# Patient Record
Sex: Female | Born: 1970 | Race: White | Hispanic: No | Smoking: Former smoker
Health system: Southern US, Community
[De-identification: ages and names within clinical notes are randomized; demographics above are authoritative.]

## PROBLEM LIST (undated history)

## (undated) DIAGNOSIS — I739 Peripheral vascular disease, unspecified: Secondary | ICD-10-CM

## (undated) DIAGNOSIS — R112 Nausea with vomiting, unspecified: Secondary | ICD-10-CM

## (undated) DIAGNOSIS — Z87442 Personal history of urinary calculi: Secondary | ICD-10-CM

## (undated) DIAGNOSIS — K219 Gastro-esophageal reflux disease without esophagitis: Secondary | ICD-10-CM

## (undated) DIAGNOSIS — K635 Polyp of colon: Secondary | ICD-10-CM

## (undated) DIAGNOSIS — F32A Depression, unspecified: Secondary | ICD-10-CM

## (undated) DIAGNOSIS — K5790 Diverticulosis of intestine, part unspecified, without perforation or abscess without bleeding: Secondary | ICD-10-CM

## (undated) DIAGNOSIS — F419 Anxiety disorder, unspecified: Secondary | ICD-10-CM

## (undated) DIAGNOSIS — K529 Noninfective gastroenteritis and colitis, unspecified: Secondary | ICD-10-CM

## (undated) DIAGNOSIS — Z9889 Other specified postprocedural states: Secondary | ICD-10-CM

## (undated) HISTORY — PX: APPENDECTOMY: SHX54

## (undated) HISTORY — PX: UPPER GI ENDOSCOPY: SHX6162

## (undated) HISTORY — DX: Depression, unspecified: F32.A

## (undated) HISTORY — PX: TUBAL LIGATION: SHX77

## (undated) HISTORY — DX: Noninfective gastroenteritis and colitis, unspecified: K52.9

---

## 1998-08-20 ENCOUNTER — Inpatient Hospital Stay (HOSPITAL_COMMUNITY): Admission: AD | Admit: 1998-08-20 | Discharge: 1998-08-20 | Payer: Self-pay | Admitting: Obstetrics & Gynecology

## 2003-03-23 ENCOUNTER — Encounter: Admission: RE | Admit: 2003-03-23 | Discharge: 2003-03-23 | Payer: Self-pay | Admitting: Obstetrics and Gynecology

## 2003-08-08 ENCOUNTER — Encounter: Admission: RE | Admit: 2003-08-08 | Discharge: 2003-08-08 | Payer: Self-pay | Admitting: Obstetrics and Gynecology

## 2004-04-02 ENCOUNTER — Ambulatory Visit (HOSPITAL_COMMUNITY): Admission: RE | Admit: 2004-04-02 | Discharge: 2004-04-02 | Payer: Self-pay | Admitting: Obstetrics

## 2004-05-10 ENCOUNTER — Ambulatory Visit (HOSPITAL_COMMUNITY): Admission: RE | Admit: 2004-05-10 | Discharge: 2004-05-10 | Payer: Self-pay | Admitting: Obstetrics

## 2004-07-10 ENCOUNTER — Ambulatory Visit (HOSPITAL_COMMUNITY): Admission: RE | Admit: 2004-07-10 | Discharge: 2004-07-10 | Payer: Self-pay | Admitting: Obstetrics

## 2005-07-01 ENCOUNTER — Ambulatory Visit: Payer: Self-pay | Admitting: Gastroenterology

## 2005-07-16 ENCOUNTER — Encounter (INDEPENDENT_AMBULATORY_CARE_PROVIDER_SITE_OTHER): Payer: Self-pay | Admitting: *Deleted

## 2005-07-16 ENCOUNTER — Ambulatory Visit: Payer: Self-pay | Admitting: Gastroenterology

## 2005-08-25 ENCOUNTER — Ambulatory Visit: Payer: Self-pay | Admitting: Gastroenterology

## 2007-04-02 ENCOUNTER — Encounter: Payer: Self-pay | Admitting: Obstetrics & Gynecology

## 2007-04-03 ENCOUNTER — Encounter (INDEPENDENT_AMBULATORY_CARE_PROVIDER_SITE_OTHER): Payer: Self-pay | Admitting: Specialist

## 2007-04-04 ENCOUNTER — Inpatient Hospital Stay (HOSPITAL_COMMUNITY): Admission: EM | Admit: 2007-04-04 | Discharge: 2007-04-06 | Payer: Self-pay | Admitting: Emergency Medicine

## 2007-04-08 ENCOUNTER — Inpatient Hospital Stay (HOSPITAL_COMMUNITY): Admission: RE | Admit: 2007-04-08 | Discharge: 2007-04-12 | Payer: Self-pay | Admitting: *Deleted

## 2007-04-14 ENCOUNTER — Ambulatory Visit (HOSPITAL_COMMUNITY): Admission: RE | Admit: 2007-04-14 | Discharge: 2007-04-14 | Payer: Self-pay | Admitting: *Deleted

## 2007-04-17 ENCOUNTER — Emergency Department (HOSPITAL_COMMUNITY): Admission: EM | Admit: 2007-04-17 | Discharge: 2007-04-17 | Payer: Self-pay | Admitting: Emergency Medicine

## 2011-04-22 NOTE — Op Note (Signed)
NAMESULEMA, BRAID                   ACCOUNT NO.:  [id number]   MEDICAL RECORD NO.:  [id number]          PATIENT TYPE:  OBV   LOCATION:  1610                         FACILITY:  [facility]   PHYSICIAN:  Alfonse Ras, MD   DATE OF BIRTH:  1971-11-28   DATE OF PROCEDURE:  04/03/2007  DATE OF DISCHARGE:                               OPERATIVE REPORT   PREOPERATIVE DIAGNOSIS:  Acute appendicitis.   POSTOPERATIVE DIAGNOSIS:  Acute appendicitis.   PROCEDURE:  Laparoscopic appendectomy.   SURGEON:  Alfonse Ras, MD   ANESTHESIA:  General.   SPECIMEN:  Suppurative appendix.  No evidence of abscess.   ESTIMATED BLOOD LOSS:  Minimal.   DESCRIPTION:  The patient was taken to the operating room and placed in  supine position.  After adequate general anesthesia was induced using an  endotracheal tube, a Foley catheter was placed and the abdomen was  prepped and draped in the normal sterile fashion.  Using a left upper  quadrant incision, in the OptiView 12 mm trocar was placed under direct  vision into the peritoneal cavity.  Pneumoperitoneum was obtained.  Additional 12 mm trocar was placed in the left lower quadrant and 5 mm  trocar was placed in the left mid abdomen.  The appendix was identified,  was in a lateral position to the cecum.  It was mobilized.  It was quite  suppurative but there was no evidence of perforation.  The mesoappendix  was taken down with the harmonic scalpel until the base of the appendix  was completely skeletonized at its junction with the cecum.  This was  transected using a GIA white load stapling device.  The appendix was  then placed in an EndoCatch bag and removed through the left lower  quadrant port.  The right lower quadrant was then irrigated.  Adequate  hemostasis was ensured.  Pneumoperitoneum was released.  Trocars were  removed and skin incisions were closed with subcuticular 4-0 Monocryl.  Steri-Strips, sterile dressings were applied.  The  patient tolerated the  procedure well and went to PACU in good condition.      Alfonse Ras, MD  Electronically Signed     KRE/MEDQ  D:  04/03/2007  T:  04/03/2007  Job:  [id number]

## 2011-04-22 NOTE — Discharge Summary (Signed)
Haley Charles, Haley Charles                   ACCOUNT NO.:  [id number]   MEDICAL RECORD NO.:  [id number]          PATIENT TYPE:  INP   LOCATION:  1317                         [facility]ACILITY:  [facility]   PHYSICIAN:  Alfonse Ras, MD   DATE OF BIRTH:  06-27-1971   DATE OF ADMISSION:  04/08/2007  DATE OF DISCHARGE:  04/12/2007                               DISCHARGE SUMMARY   ADMISSION DIAGNOSIS:  Probable pelvis abscess status post laparoscopic  appendectomy.   DISCHARGE DIAGNOSIS:  Probable pelvis abscess status post laparoscopic  appendectomy.   DISPOSITION:  Discharge to home.   FOLLOWUP:  Followup with me in 2 days after CT scan, and then follow up  with me in the office in 3 weeks.   MEDICATIONS:  1. Cipro 500 mg p.o. b.i.d.  2. Flagyl 500 mg p.o. b.i.d.   HOSPITAL COURSE:  Patient was admitted after being seen in the office 4  days status post laparoscopic appendectomy with abdominal pain, nausea  and vomiting.  Patient was admitted, had NG tube placed, however she  pulled it out that evening.  She underwent CT scan which showed some  fluid in the pelvis which was tapped on hospital day #2.  This had no  growth but abundant white cells.  She had 2 temperature spikes to 102 on  the evening of admission and was started on Cipro and Flagyl IV.  Over  the next 2-3 days, patient was taking p.o. well, having bowel movements,  had no abdominal pain.  Of note, her white count did slowly drift up  from low of 7.9 at admission to 13,000 on the day of admission.  Because  the patient feels so well, looks well, has no abdominal pain, no  abdominal tenderness and cultures are negative, I am going to send her  home on Cipro and Flagyl, and follow up with a CT scan in 2 days.      Alfonse Ras, MD  Electronically Signed     KRE/MEDQ  D:  04/12/2007  T:  04/12/2007  Job:  [id number]

## 2011-04-22 NOTE — Discharge Summary (Signed)
Haley Charles, Haley Charles                   ACCOUNT NO.:  [id number]   MEDICAL RECORD NO.:  [id number]          PATIENT TYPE:  INP   LOCATION:  1432                         FACILITY:  [facility]   PHYSICIAN:  Alfonse Ras, MD   DATE OF BIRTH:  1971/08/03   DATE OF ADMISSION:  04/03/2007  DATE OF DISCHARGE:  04/06/2007                               DISCHARGE SUMMARY   ADMISSION DIAGNOSES:  Acute appendicitis.   DISCHARGE DIAGNOSES:  Same.   CONDITION ON DISCHARGE:  Good, improved.   DISPOSITION:  Follow up with me in one week.   MEDICATIONS:  1. Vicodin for pain.  2. Motrin.   HOSPITAL COURSE:  The patient was admitted by Dr. Tamela Oddi at  [facility] with a diagnosis of acute appendicitis that was based on  CT scan and elevated white count 25,000.  She was transferred to [facility] where she underwent laparoscopic appendectomy.  She  remained afebrile for the first 24 hours, however, then began having  fever to 101 and 102.  Antibiotics were resumed.  PCA was stopped and  diet was advanced.  By postoperative day #3, her white count had  decreased down to 18,000.  She tolerated a regular diet.  She remained  afebrile for the previous 36 hours and was anxious to go home.  Her  abdomen was benign at the time of discharge.  Follow-up is with me and  one week.      Alfonse Ras, MD  Electronically Signed     KRE/MEDQ  D:  04/06/2007  T:  04/06/2007  Job:  [id number]

## 2011-04-25 NOTE — Op Note (Signed)
NAME:  Haley Charles, Haley Charles                             ACCOUNT NO.:  [id number]   MEDICAL RECORD NO.:  [id number]                   PATIENT TYPE:  AMB   LOCATION:  SDC                                  FACILITY:  [facility]   PHYSICIAN:  Charles A. Clearance Coots, M.D.             DATE OF BIRTH:  [birth year]   DATE OF PROCEDURE:  05/10/2004  DATE OF DISCHARGE:                                 OPERATIVE REPORT   PREOPERATIVE DIAGNOSIS:  Menorrhagia.   POSTOPERATIVE DIAGNOSIS:  Menorrhagia.   PROCEDURE:  Bipolar endometrial ablation (Novasure).   SURGEON:  Charles A. Clearance Coots, M.D.   ANESTHESIA:  General.   ESTIMATED BLOOD LOSS:  Negligible.   COMPLICATIONS:  None.   SPECIMENS:  None.   OPERATION:  The patient was brought to the operating room and after  satisfactory general endotracheal anesthesia,   Dictation ends here.                                               Charles A. Clearance Coots, M.D.    CAH/MEDQ  D:  05/10/2004  T:  05/10/2004  Job:  [id number]

## 2011-04-25 NOTE — Op Note (Signed)
NAME:  Haley Charles, Haley Charles                             ACCOUNT NO.:  [id number]   MEDICAL RECORD NO.:  [id number]                   PATIENT TYPE:  AMB   LOCATION:  SDC                                  [facility]ACILITY[facility]  [facility]   PHYSICIAN:  Charles A. Clearance Coots, M.D.             DATE OF BIRTH:  Sep 09, 1971   DATE OF PROCEDURE:  05/10/2004  DATE OF DISCHARGE:                                 OPERATIVE REPORT   PREOPERATIVE DIAGNOSIS:  Menorrhagia.   POSTOPERATIVE DIAGNOSIS:  Menorrhagia.   PROCEDURE:  Bipolar endometrial ablation (Novasure).   SURGEON:  Charles A. Clearance Coots, M.D.   ANESTHESIA:  General.   ESTIMATED BLOOD LOSS:  Negligible.   COMPLICATIONS:  None.   SPECIMENS:  None.   OPERATION:  The patient was brought to the operating room and after  satisfactory general endotracheal anesthesia, the legs were brought up in  stirrups.  The vagina was prepped and draped in the usual sterile fashion.  The urinary bladder was emptied of approximately 50 mL of clear urine.  Bimanual examination revealed the uterus to be mid position, about eight  weeks size.  A sterile speculum was inserted in the vaginal vault and the  cervix was isolated.  The anterior lip of the cervix was grasped with a  single tooth tenaculum.  The uterine cavity was then sounded of 7.5 cm.  The  bipolar endometrial ablation technique was performed in routine fashion  without complications with an ablation power of 97 watts and time of 45  seconds.  The patient tolerated the procedure well.  Hysteroscopy was  performed both before and after the procedure.  All instruments were  retired.  The patient was transferred to the recovery room in satisfactory  condition.                                               Charles A. Clearance Coots, M.D.    CAH/MEDQ  D:  05/10/2004  T:  05/10/2004  Job:  [id number]

## 2011-04-25 NOTE — Group Therapy Note (Signed)
   NAME:  Haley Charles, Haley Charles NO.:  [id number]   MEDICAL RECORD NO.:  [id number]                   PATIENT TYPE:  OUT   LOCATION:  [facility]                           FACILITY:  [facility]   PHYSICIAN:  Argentina Donovan, MD                     DATE OF BIRTH:  02/06/1971   DATE OF SERVICE:  08/08/2003                                    CLINIC NOTE   HISTORY OF PRESENT ILLNESS:  The patient is a 40 year old para 3-0-0-3,  female, status post bilateral tubal ligation and six year history of  menstrual irregularities.  The patient was seen in the clinic in April 2004  and placed on Ortho Tri-Cyclen Lo, which seemed to have regulated her  period.  The patient did well until she ran out of the pills six weeks ago  and has had two irregular cycles.  The patient also seems to be have anxiety  and irritability and mood swings that are worse the took weeks before her  period.  She has a lot of social problems.  Her 16-year-old son has severe  ADHD and she is having a lot of issues dealing with him which his behavior  contributes to her stress.  She had spoken with Dr. 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  At this point, she requests more Ortho Tri-Cyclen Lo not only for  cycle regulation but she said she feels like it eases out her emotions.  The  patient has no other complaints or change in health.  She has had normal Pap  smear in April 2004.   SOCIAL HISTORY:  She has a one pack per day history of tobacco and knows to  quit.   PLAN:  The patient was given samples of Ortho Tri-Cyclen Lo and a  prescription and is to return to clinic in April 2005 for a Pap smear.                                               Argentina Donovan, MD    PR/MEDQ  D:  08/08/2003  T:  08/08/2003  Job:  [id number]

## 2018-03-22 ENCOUNTER — Encounter: Payer: Self-pay | Admitting: Physician Assistant

## 2018-04-08 ENCOUNTER — Ambulatory Visit: Payer: Self-pay | Admitting: Physician Assistant

## 2018-04-15 ENCOUNTER — Encounter: Payer: Self-pay | Admitting: Physician Assistant

## 2018-04-15 ENCOUNTER — Ambulatory Visit (INDEPENDENT_AMBULATORY_CARE_PROVIDER_SITE_OTHER): Payer: Self-pay | Admitting: Physician Assistant

## 2018-04-15 ENCOUNTER — Other Ambulatory Visit (INDEPENDENT_AMBULATORY_CARE_PROVIDER_SITE_OTHER): Payer: Self-pay

## 2018-04-15 VITALS — BP 110/78 | HR 91 | Ht 62.0 in | Wt 192.0 lb

## 2018-04-15 DIAGNOSIS — K625 Hemorrhage of anus and rectum: Secondary | ICD-10-CM

## 2018-04-15 DIAGNOSIS — R109 Unspecified abdominal pain: Secondary | ICD-10-CM

## 2018-04-15 LAB — CBC WITH DIFFERENTIAL/PLATELET
BASOS PCT: 1.3 % (ref 0.0–3.0)
Basophils Absolute: 0.1 10*3/uL (ref 0.0–0.1)
EOS ABS: 0.2 10*3/uL (ref 0.0–0.7)
Eosinophils Relative: 1.9 % (ref 0.0–5.0)
HCT: 39.4 % (ref 36.0–46.0)
HEMOGLOBIN: 13.7 g/dL (ref 12.0–15.0)
Lymphocytes Relative: 29.2 % (ref 12.0–46.0)
Lymphs Abs: 2.9 10*3/uL (ref 0.7–4.0)
MCHC: 34.7 g/dL (ref 30.0–36.0)
MCV: 86 fl (ref 78.0–100.0)
MONO ABS: 0.7 10*3/uL (ref 0.1–1.0)
Monocytes Relative: 7 % (ref 3.0–12.0)
NEUTROS ABS: 6 10*3/uL (ref 1.4–7.7)
Neutrophils Relative %: 60.6 % (ref 43.0–77.0)
PLATELETS: 305 10*3/uL (ref 150.0–400.0)
RBC: 4.58 Mil/uL (ref 3.87–5.11)
RDW: 11.6 % (ref 11.5–15.5)
WBC: 10 10*3/uL (ref 4.0–10.5)

## 2018-04-15 LAB — SEDIMENTATION RATE: Sed Rate: 34 mm/hr — ABNORMAL HIGH (ref 0–20)

## 2018-04-15 LAB — COMPREHENSIVE METABOLIC PANEL
ALBUMIN: 4.4 g/dL (ref 3.5–5.2)
ALT: 26 U/L (ref 0–35)
AST: 20 U/L (ref 0–37)
Alkaline Phosphatase: 105 U/L (ref 39–117)
BUN: 13 mg/dL (ref 6–23)
CHLORIDE: 104 meq/L (ref 96–112)
CO2: 27 mEq/L (ref 19–32)
Calcium: 10 mg/dL (ref 8.4–10.5)
Creatinine, Ser: 0.7 mg/dL (ref 0.40–1.20)
GFR: 95.76 mL/min (ref >60.00)
Glucose, Bld: 147 mg/dL — ABNORMAL HIGH (ref 70–99)
Potassium: 3.8 mEq/L (ref 3.5–5.1)
Sodium: 138 mEq/L (ref 135–145)
Total Bilirubin: 0.5 mg/dL (ref 0.2–1.2)
Total Protein: 7.7 g/dL (ref 6.0–8.3)

## 2018-04-15 LAB — HIGH SENSITIVITY CRP: CRP, High Sensitivity: 4.57 mg/L (ref 0.000–5.000)

## 2018-04-15 NOTE — Patient Instructions (Signed)
Your provider has requested that you go to the basement level for lab work before leaving today. Press "B" on the elevator. The lab is located at the first door on the left as you exit the elevator. You have been scheduled for a colonoscopy. Please follow written instructions given to you at your visit today.  We h ave provided you with the colonoscopy prep- Plenvu. If you use inhalers (even only as needed), please bring them with you on the day of your procedure. Your physician has requested that you go to www.startemmi.com and enter the access code given to you at your visit today. This web site gives a general overview about your procedure. However, you should still follow specific instructions given to you by our office regarding your preparation for the procedure.  IIf you are age 55 or younger, your body mass index should be between 19-25. Your Body mass index is 35.12 kg/m. If this is out of the aformentioned range listed, please consider follow up with your Primary Care Provider.

## 2018-04-15 NOTE — Progress Notes (Signed)
I agree with the above note, plan 

## 2018-04-15 NOTE — Progress Notes (Signed)
Subjective:    Patient ID: Haley Charles, female    DOB: 23-Dec-1970, 47 y.o.   MRN: [id number]  HPI [name] is a pleasant 47 year old female, self-referred today and known remotely to Dr. Ardis Hughs from 2006. She comes in today with onset of abdominal cramping in January 2019 which has been persistent and gradually worsening since.  He says she has had to be very careful with what she has been eating and has become very sensitive to fatty foods greasy foods etc.  She is been having ongoing soreness primarily in the right side of her abdomen and has been using a heating pad frequently.  No nausea or vomiting but says she is been somewhat afraid to eat for fear of aggravating her symptoms.  No chronic reflux type symptoms.  She generally has problems with constipation which is been somewhat worse and may go 4 to 5 days between bowel movements.  Is not currently using any regular laxatives. She has been noticing blood mixed with her stool intermittently small-volume since January.   She has been taking some 800 mg ibuprofen as needed for her right-sided abdominal pain but says the pain started way before she started the ibuprofen. Is not had any recent labs or imaging. EGD and colonoscopy in 2006 showed normal endoscopy colonoscopy with some mild distal colitis and a single erosion in the terminal ileum.  TI biopsy showed no active inflammation and rectal biopsies showed mild cryptitis abscess from the left colon showed no active inflammation.  Review of Systems Pertinent positive and negative review of systems were noted in the above HPI section.  All other review of systems was otherwise negative.  No outpatient encounter medications on file as of 04/15/2018.   No facility-administered encounter medications on file as of 04/15/2018.    Not on File There are no active problems to display for this patient.  Social History   Socioeconomic History  . Marital status: Legally Separated    Spouse name: Not on  file  . Number of children: Not on file  . Years of education: Not on file  . Highest education level: Not on file  Occupational History  . Not on file  Social Needs  . Financial resource strain: Not on file  . Food insecurity:    Worry: Not on file    Inability: Not on file  . Transportation needs:    Medical: Not on file    Non-medical: Not on file  Tobacco Use  . Smoking status: Not on file  Substance and Sexual Activity  . Alcohol use: Not on file  . Drug use: Not on file  . Sexual activity: Not on file  Lifestyle  . Physical activity:    Days per week: Not on file    Minutes per session: Not on file  . Stress: Not on file  Relationships  . Social connections:    Talks on phone: Not on file    Gets together: Not on file    Attends religious service: Not on file    Active member of club or organization: Not on file    Attends meetings of clubs or organizations: Not on file    Relationship status: Not on file  . Intimate partner violence:    Fear of current or ex partner: Not on file    Emotionally abused: Not on file    Physically abused: Not on file    Forced sexual activity: Not on file  Other Topics Concern  .  Not on file  Social History Narrative  . Not on file    Ms. Eiben's family history includes CAD in her father; Colon polyps in her mother; Diabetes Mellitus II in her father; Esophagitis in her mother; Hypertension in her father; Lung cancer in her father.      Objective:    Vitals:   04/15/18 0830  BP: 110/78  Pulse: 91    Physical Exam; well-developed female in no acute distress, pleasant pressure 110/78 pulse 91, height 5 foot 2, weight 192, BMI 35.1.  HEENT ;nontraumatic normocephalic EOMI PERRLA sclera anicteric, oropharynx benign Cardiovascular ;regular rate and rhythm with S1-S2 no murmur rub or gallop, Pulmonary ;clear bilaterally, Abdomen ;soft, she is tender in the right mid abdomen and right lower abdomen, no palpable mass, no guarding or  rebound no palpable hepatosplenomegaly, bowel sounds are present.  Rectal ;exam not done, Extremities; no clubbing cyanosis or edema skin warm and dry, neuro psych; alert and oriented, grossly nonfocal mood and affect appropriate       Assessment & Plan:   #55 47 year old white female with 61-monthhistory of primarily right-sided abdominal discomfort, abdominal cramping, increase in constipation and intermittent rectal bleeding. The etiology of symptoms is not clear.  Rule out IBD, rule out occult colon lesion.  Patient did undergo colonoscopy in 2006 some very minimal distal colitis noted, however biopsy showed no active inflammation other than rectal biopsy showing minimal cryptitis.   Plan; CBC with differential, C met, sed rate and CRP Patient will be scheduled for colonoscopy with Dr. JArdis Hughs  Procedure was discussed in detail with patient including indications risks and benefits and she is agreeable to proceed. Stop all NSAID use, she may use Tylenol as needed. Further plans pending findings of above.   Jelan Batterton S Franciso Dierks PA-C 04/15/2018   Cc: No ref. provider found

## 2018-04-16 ENCOUNTER — Encounter: Payer: Self-pay | Admitting: Gastroenterology

## 2018-04-16 ENCOUNTER — Ambulatory Visit (AMBULATORY_SURGERY_CENTER): Payer: Self-pay | Admitting: Gastroenterology

## 2018-04-16 ENCOUNTER — Other Ambulatory Visit: Payer: Self-pay

## 2018-04-16 ENCOUNTER — Telehealth: Payer: Self-pay

## 2018-04-16 VITALS — BP 115/73 | HR 80 | Temp 98.7°F | Resp 14 | Ht 62.0 in | Wt 192.0 lb

## 2018-04-16 DIAGNOSIS — R109 Unspecified abdominal pain: Secondary | ICD-10-CM | POA: Diagnosis not present

## 2018-04-16 DIAGNOSIS — D123 Benign neoplasm of transverse colon: Secondary | ICD-10-CM

## 2018-04-16 DIAGNOSIS — K625 Hemorrhage of anus and rectum: Secondary | ICD-10-CM

## 2018-04-16 DIAGNOSIS — D124 Benign neoplasm of descending colon: Secondary | ICD-10-CM

## 2018-04-16 DIAGNOSIS — K649 Unspecified hemorrhoids: Secondary | ICD-10-CM

## 2018-04-16 DIAGNOSIS — K5730 Diverticulosis of large intestine without perforation or abscess without bleeding: Secondary | ICD-10-CM

## 2018-04-16 HISTORY — PX: COLONOSCOPY: SHX174

## 2018-04-16 MED ORDER — SODIUM CHLORIDE 0.9 % IV SOLN: 500.0000 mL | Freq: Once | INTRAVENOUS | Status: DC

## 2018-04-16 NOTE — Progress Notes (Signed)
Report given to PACU, vss 

## 2018-04-16 NOTE — Op Note (Addendum)
[place] Patient Name: Haley Charles Procedure Date: 04/16/2018 1:10 PM MRN: [id number] Endoscopist: Milus Banister , MD Age: 47 Referring MD:  Date of Birth: 06-21-1971 Gender: Female Account #: [id number] Procedure:                Colonoscopy Indications:              Abdominal pain in the right upper quadrant, Rectal                            bleeding Medicines:                Monitored Anesthesia Care Procedure:                Pre-Anesthesia Assessment:                           - Prior to the procedure, a History and Physical                            was performed, and patient medications and                            allergies were reviewed. The patient's tolerance of                            previous anesthesia was also reviewed. The risks                            and benefits of the procedure and the sedation                            options and risks were discussed with the patient.                            All questions were answered, and informed consent                            was obtained. Prior Anticoagulants: The patient has                            taken no previous anticoagulant or antiplatelet                            agents. ASA Grade Assessment: II - A patient with                            mild systemic disease. After reviewing the risks                            and benefits, the patient was deemed in                            satisfactory condition to undergo the procedure.  After obtaining informed consent, the colonoscope                            was passed under direct vision. Throughout the                            procedure, the patient's blood pressure, pulse, and                            oxygen saturations were monitored continuously. The                            Colonoscope was introduced through the anus and                            advanced to the the terminal ileum. The colonoscopy                           was performed without difficulty. The patient                            tolerated the procedure well. The quality of the                            bowel preparation was good. The terminal ileum,                            ileocecal valve, appendiceal orifice, and rectum                            were photographed. Scope In: 1:18:39 PM Scope Out: 1:29:25 PM Scope Withdrawal Time: 0 hours 7 minutes 52 seconds  Total Procedure Duration: 0 hours 10 minutes 46 seconds  Findings:                 The terminal ileum appeared normal.                           Three sessile polyps were found in the descending                            colon and transverse colon. The polyps were 2 to 3                            mm in size. These polyps were removed with a cold                            snare. Resection and retrieval were complete.                           Multiple small and large-mouthed diverticula were                            found in the left colon.  Small internal hemorrhoids.                           The exam was otherwise without abnormality on                            direct and retroflexion views. Complications:            No immediate complications. Estimated blood loss:                            None. Estimated Blood Loss:     Estimated blood loss: none. Impression:               - The examined portion of the ileum was normal.                           - Three 2 to 3 mm polyps in the descending colon                            and in the transverse colon, removed with a cold                            snare. Resected and retrieved.                           - Diverticulosis in the left colon.                           - Hemorrhoids.                           - The examination was otherwise normal on direct                            and retroflexion views.                           - These findings do NOT explain your right  flank                            pain. My office will arrange further testing with                            CT scan abd/pelvis with IV and oral contrast. Recommendation:           - Patient has a contact number available for                            emergencies. The signs and symptoms of potential                            delayed complications were discussed with the                            patient. Return to normal activities  tomorrow.                            Written discharge instructions were provided to the                            patient.                           - Resume previous diet.                           - Continue present medications.                           You will receive a letter within 2-3 weeks with the                            pathology results and my final recommendations.                           If the polyp(s) is proven to be 'pre-cancerous' on                            pathology, you will need repeat colonoscopy in 3-5                            years. If the polyp(s) is NOT 'precancerous' on                            pathology then you should repeat colon cancer                            screening in 10 years with colonoscopy without need                            for colon cancer screening by any method prior to                            then (including stool testing). Milus Banister, MD 04/16/2018 1:36:59 PM This report has been signed electronically.

## 2018-04-16 NOTE — Progress Notes (Signed)
Called to room to assist during endoscopic procedure.  Patient ID and intended procedure confirmed with present staff. Received instructions for my participation in the procedure from the performing physician.  

## 2018-04-16 NOTE — Patient Instructions (Signed)
YOU HAD AN ENDOSCOPIC PROCEDURE TODAY AT [facility]:   Refer to the procedure report that was given to you for any specific questions about what was found during the examination.  If the procedure report does not answer your questions, please call your gastroenterologist to clarify.  If you requested that your care partner not be given the details of your procedure findings, then the procedure report has been included in a sealed envelope for you to review at your convenience later. Dr. Eugenia Pancoast office will call you with an appointment fore abdominal CT scan.  You have been given the contrast for this procedure, place in the refrigerator once home.   Handouts given:CT scan instruction sheet, Diverticulosis and Polyps.      YOU SHOULD EXPECT: Some feelings of bloating in the abdomen. Passage of more gas than usual.  Walking can help get rid of the air that was put into your GI tract during the procedure and reduce the bloating. If you had a lower endoscopy (such as a colonoscopy or flexible sigmoidoscopy) you may notice spotting of blood in your stool or on the toilet paper. If you underwent a bowel prep for your procedure, you may not have a normal bowel movement for a few days.  Please Note:  You might notice some irritation and congestion in your nose or some drainage.  This is from the oxygen used during your procedure.  There is no need for concern and it should clear up in a day or so.  SYMPTOMS TO REPORT IMMEDIATELY:   Following lower endoscopy (colonoscopy or flexible sigmoidoscopy):  Excessive amounts of blood in the stool  Significant tenderness or worsening of abdominal pains  Swelling of the abdomen that is new, acute  Fever of 100F or higher   For urgent or emergent issues, a gastroenterologist can be reached at any hour by calling [phone].   DIET:  We do recommend a small meal at first, but then you may proceed to your regular diet.  Drink plenty of  fluids but you should avoid alcoholic beverages for 24 hours.  ACTIVITY:  You should plan to take it easy for the rest of today and you should NOT DRIVE or use heavy machinery until tomorrow (because of the sedation medicines used during the test).    FOLLOW UP: Our staff will call the number listed on your records the next business day following your procedure to check on you and address any questions or concerns that you may have regarding the information given to you following your procedure. If we do not reach you, we will leave a message.  However, if you are feeling well and you are not experiencing any problems, there is no need to return our call.  We will assume that you have returned to your regular daily activities without incident.  If any biopsies were taken you will be contacted by phone or by letter within the next 1-3 weeks.  Please call us at [phone] if you have not heard about the biopsies in 3 weeks.    SIGNATURES/CONFIDENTIALITY: You and/or your care partner have signed paperwork which will be entered into your electronic medical record.  These signatures attest to the fact that that the information above on your After Visit Summary has been reviewed and is understood.  Full responsibility of the confidentiality of this discharge information lies with you and/or your care-partner.

## 2018-04-16 NOTE — Telephone Encounter (Signed)
Appt scheduled per procedure report  Unable to reach pt by phone.  Mail box full    You have been scheduled for a CT scan of the abdomen and pelvis at [place] ([address]---this is in the same building as [employer]).   You are scheduled on 04/27/18 at 330 pm. You should arrive 15 minutes prior to your appointment time for registration. Please follow the written instructions below on the day of your exam:  WARNING: IF YOU ARE ALLERGIC TO IODINE/X-RAY DYE, PLEASE NOTIFY RADIOLOGY IMMEDIATELY AT [phone]! YOU WILL BE GIVEN A 13 HOUR PREMEDICATION PREP.  1) Do not eat or drink anything after 1130 am (4 hours prior to your test) 2) You have been given 2 bottles of oral contrast to drink. The solution may taste better if refrigerated, but do NOT add ice or any other liquid to this solution. Shake well before drinking.                          Drink 1 bottle of contrast @ 130 pm (2 hours prior to your exam)             Drink 1 bottle of contrast @ 230 pm  (1 hour prior to your exam)  You may take any medications as prescribed with a small amount of water except for the following: Metformin, Glucophage, Glucovance, Avandamet, Riomet, Fortamet, Actoplus Met, Janumet, Glumetza or Metaglip. The above medications must be held the day of the exam AND 48 hours after the exam.  The purpose of you drinking the oral contrast is to aid in the visualization of your intestinal tract. The contrast solution may cause some diarrhea. Before your exam is started, you will be given a small amount of fluid to drink. Depending on your individual set of symptoms, you may also receive an intravenous injection of x-ray contrast/dye. Plan on being at [facility] for 30 minutes or longer, depending on the type of exam you are having performed.  This test typically takes 30-45 minutes to complete.  If you have any questions regarding your exam or if you need to reschedule, you may  call the CT department at [phone] between the hours of 8:00 am and 5:00 pm, Monday-Friday.

## 2018-04-16 NOTE — Progress Notes (Signed)
You have been scheduled for a CT scan of the abdomen and pelvis at [place] ([address]---this is in the same building as [employer]).   You are scheduled on 04/27/18 at 330 pm. You should arrive 15 minutes prior to your appointment time for registration. Please follow the written instructions below on the day of your exam:  WARNING: IF YOU ARE ALLERGIC TO IODINE/X-RAY DYE, PLEASE NOTIFY RADIOLOGY IMMEDIATELY AT [phone]! YOU WILL BE GIVEN A 13 HOUR PREMEDICATION PREP.  1) Do not eat or drink anything after 1130 am (4 hours prior to your test) 2) You have been given 2 bottles of oral contrast to drink. The solution may taste better if refrigerated, but do NOT add ice or any other liquid to this solution. Shake well before drinking.    Drink 1 bottle of contrast @ 130 pm (2 hours prior to your exam)  Drink 1 bottle of contrast @ 230 pm  (1 hour prior to your exam)  You may take any medications as prescribed with a small amount of water except for the following: Metformin, Glucophage, Glucovance, Avandamet, Riomet, Fortamet, Actoplus Met, Janumet, Glumetza or Metaglip. The above medications must be held the day of the exam AND 48 hours after the exam.  The purpose of you drinking the oral contrast is to aid in the visualization of your intestinal tract. The contrast solution may cause some diarrhea. Before your exam is started, you will be given a small amount of fluid to drink. Depending on your individual set of symptoms, you may also receive an intravenous injection of x-ray contrast/dye. Plan on being at [facility] for 30 minutes or longer, depending on the type of exam you are having performed.  This test typically takes 30-45 minutes to complete.  If you have any questions regarding your exam or if you need to reschedule, you may call the CT department at [phone] between the hours of 8:00 am and 5:00 pm,  Monday-Friday.  ________________________________________________________________________

## 2018-04-19 ENCOUNTER — Telehealth: Payer: Self-pay | Admitting: *Deleted

## 2018-04-19 NOTE — Telephone Encounter (Signed)
Left message on machine to call back  

## 2018-04-19 NOTE — Telephone Encounter (Signed)
  Follow up Call-  Call back number 04/16/2018  Post procedure Call Back phone  # [phone]  Permission to leave phone message Yes  Some recent data might be hidden     Patient questions:  Do you have a fever, pain , or abdominal swelling? No. Pain Score  0 *  Have you tolerated food without any problems? Yes.    Have you been able to return to your normal activities? Yes.    Do you have any questions about your discharge instructions: Diet   No. Medications  No. Follow up visit  No.  Do you have questions or concerns about your Care? No.  Actions: * If pain score is 4 or above: No action needed, pain <4.

## 2018-04-20 NOTE — Telephone Encounter (Signed)
The pt has been instructed and advised of the CT scan.  All questions answered.

## 2018-04-22 ENCOUNTER — Encounter: Payer: Self-pay | Admitting: Gastroenterology

## 2018-04-22 ENCOUNTER — Telehealth: Payer: Self-pay | Admitting: *Deleted

## 2018-04-22 NOTE — Telephone Encounter (Signed)
The patient emailed her insurance card information to me in my [facility] email.  I informed April McPeak and she had is scanned into the patient's chart.

## 2018-04-22 NOTE — Telephone Encounter (Signed)
-----   Message from Dow Adolph sent at 04/16/2018 12:23 PM EDT ----- Uvaldo Bristle  Did this patient suppose to email you a copy of her insurance card yesterday?  Thanks [employer]

## 2018-04-27 ENCOUNTER — Ambulatory Visit (INDEPENDENT_AMBULATORY_CARE_PROVIDER_SITE_OTHER)
Admission: RE | Admit: 2018-04-27 | Discharge: 2018-04-27 | Disposition: A | Discharge status: Home / Self Care | Payer: PRIVATE HEALTH INSURANCE | Source: Ambulatory Visit | Attending: Gastroenterology | Admitting: Gastroenterology

## 2018-04-27 DIAGNOSIS — R109 Unspecified abdominal pain: Secondary | ICD-10-CM | POA: Diagnosis not present

## 2018-04-27 MED ORDER — IOPAMIDOL (ISOVUE-300) INJECTION 61%
100.0000 mL | Freq: Once | INTRAVENOUS | Status: AC | PRN
  Administered 2018-04-27: 100 mL via INTRAVENOUS

## 2018-05-14 ENCOUNTER — Other Ambulatory Visit: Payer: Self-pay | Admitting: Urology

## 2018-05-14 ENCOUNTER — Other Ambulatory Visit (HOSPITAL_COMMUNITY): Payer: Self-pay | Admitting: Urology

## 2018-05-14 DIAGNOSIS — N201 Calculus of ureter: Secondary | ICD-10-CM

## 2018-06-15 ENCOUNTER — Encounter (HOSPITAL_COMMUNITY): Payer: Self-pay

## 2018-06-16 ENCOUNTER — Encounter (HOSPITAL_COMMUNITY)
Admission: RE | Admit: 2018-06-16 | Discharge: 2018-06-16 | Disposition: A | Discharge status: Home / Self Care | Payer: PRIVATE HEALTH INSURANCE | Source: Ambulatory Visit | Attending: Urology | Admitting: Urology

## 2018-06-16 ENCOUNTER — Other Ambulatory Visit: Payer: Self-pay

## 2018-06-16 ENCOUNTER — Encounter (HOSPITAL_COMMUNITY): Payer: Self-pay

## 2018-06-16 DIAGNOSIS — Z8249 Family history of ischemic heart disease and other diseases of the circulatory system: Secondary | ICD-10-CM | POA: Insufficient documentation

## 2018-06-16 DIAGNOSIS — R109 Unspecified abdominal pain: Secondary | ICD-10-CM | POA: Diagnosis present

## 2018-06-16 DIAGNOSIS — Z9889 Other specified postprocedural states: Secondary | ICD-10-CM | POA: Insufficient documentation

## 2018-06-16 DIAGNOSIS — Z86010 Personal history of colonic polyps: Secondary | ICD-10-CM | POA: Diagnosis not present

## 2018-06-16 DIAGNOSIS — I739 Peripheral vascular disease, unspecified: Secondary | ICD-10-CM | POA: Insufficient documentation

## 2018-06-16 DIAGNOSIS — Z79899 Other long term (current) drug therapy: Secondary | ICD-10-CM | POA: Insufficient documentation

## 2018-06-16 DIAGNOSIS — Z87891 Personal history of nicotine dependence: Secondary | ICD-10-CM | POA: Insufficient documentation

## 2018-06-16 DIAGNOSIS — N132 Hydronephrosis with renal and ureteral calculous obstruction: Secondary | ICD-10-CM | POA: Diagnosis not present

## 2018-06-16 DIAGNOSIS — Z91040 Latex allergy status: Secondary | ICD-10-CM | POA: Insufficient documentation

## 2018-06-16 DIAGNOSIS — Z8371 Family history of colonic polyps: Secondary | ICD-10-CM | POA: Diagnosis not present

## 2018-06-16 DIAGNOSIS — Z9851 Tubal ligation status: Secondary | ICD-10-CM | POA: Diagnosis not present

## 2018-06-16 DIAGNOSIS — K219 Gastro-esophageal reflux disease without esophagitis: Secondary | ICD-10-CM | POA: Diagnosis not present

## 2018-06-16 HISTORY — DX: Personal history of urinary calculi: Z87.442

## 2018-06-16 HISTORY — DX: Peripheral vascular disease, unspecified: I73.9

## 2018-06-16 HISTORY — DX: Diverticulosis of intestine, part unspecified, without perforation or abscess without bleeding: K57.90

## 2018-06-16 HISTORY — DX: Other specified postprocedural states: Z98.890

## 2018-06-16 HISTORY — DX: Polyp of colon: K63.5

## 2018-06-16 HISTORY — DX: Gastro-esophageal reflux disease without esophagitis: K21.9

## 2018-06-16 HISTORY — DX: Anxiety disorder, unspecified: F41.9

## 2018-06-16 HISTORY — DX: Nausea with vomiting, unspecified: R11.2

## 2018-06-16 LAB — CBC
HCT: 38.6 % (ref 36.0–46.0)
Hemoglobin: 13.6 g/dL (ref 12.0–15.0)
MCH: 30 pg (ref 26.0–34.0)
MCHC: 35.2 g/dL (ref 30.0–36.0)
MCV: 85.2 fL (ref 78.0–100.0)
PLATELETS: 329 10*3/uL (ref 150–400)
RBC: 4.53 MIL/uL (ref 3.87–5.11)
RDW: 12 % (ref 11.5–15.5)
WBC: 10.2 10*3/uL (ref 4.0–10.5)

## 2018-06-16 NOTE — Patient Instructions (Addendum)
Your procedure is scheduled on:  Monday, June 21, 2018   Surgery Time:  7:30AM-10:00AM   Report to [facility] Main  Entrance    Report to admitting at 5:30 AM   Call this number if you have problems the morning of surgery [phone]   Do not eat food or drink liquids :After Midnight.   Do NOT smoke after Midnight   Take these medicines the morning of surgery with A SIP OF WATER: Hydrocodone if needed                               You may not have any metal on your body including hair pins, jewelry, and body piercings             Do not wear make-up, lotions, powders, perfumes/cologne, or deodorant             Do not wear nail polish.  Do not shave  48 hours prior to surgery.               Do not bring valuables to the hospital. [facility].   Contacts, dentures or bridgework may not be worn into surgery.    Patients discharged the day of surgery will not be allowed to drive home.   Special Instructions: Bring a copy of your healthcare power of attorney and living will documents         the day of surgery if you haven't scanned them in before.              Please read over the following fact sheets you were given: [facility] - Preparing for Surgery Before surgery, you can play an important role.  Because skin is not sterile, your skin needs to be as free of germs as possible.  You can reduce the number of germs on your skin by washing with CHG (chlorahexidine gluconate) soap before surgery.  CHG is an antiseptic cleaner which kills germs and bonds with the skin to continue killing germs even after washing. Please DO NOT use if you have an allergy to CHG or antibacterial soaps.  If your skin becomes reddened/irritated stop using the CHG and inform your nurse when you arrive at Short Stay. Do not shave (including legs and underarms) for at least 48 hours prior to the first CHG shower.  You may shave your  face/neck.  Please follow these instructions carefully:  1.  Shower with CHG Soap the night before surgery and the  morning of surgery.  2.  If you choose to wash your hair, wash your hair first as usual with your normal  shampoo.  3.  After you shampoo, rinse your hair and body thoroughly to remove the shampoo.                             4.  Use CHG as you would any other liquid soap.  You can apply chg directly to the skin and wash.  Gently with a scrungie or clean washcloth.  5.  Apply the CHG Soap to your body ONLY FROM THE NECK DOWN.   Do   not use on face/ open  Wound or open sores. Avoid contact with eyes, ears mouth and   genitals (private parts).                       Wash face,  Genitals (private parts) with your normal soap.             6.  Wash thoroughly, paying special attention to the area where your    surgery  will be performed.  7.  Thoroughly rinse your body with warm water from the neck down.  8.  DO NOT shower/wash with your normal soap after using and rinsing off the CHG Soap.                9.  Pat yourself dry with a clean towel.            10.  Wear clean pajamas.            11.  Place clean sheets on your bed the night of your first shower and do not  sleep with pets. Day of Surgery : Do not apply any lotions/deodorants the morning of surgery.  Please wear clean clothes to the hospital/surgery center.  FAILURE TO FOLLOW THESE INSTRUCTIONS MAY RESULT IN THE CANCELLATION OF YOUR SURGERY  PATIENT SIGNATURE_________________________________  NURSE SIGNATURE__________________________________  ________________________________________________________________________

## 2018-06-17 ENCOUNTER — Other Ambulatory Visit: Payer: Self-pay | Admitting: Radiology

## 2018-06-18 ENCOUNTER — Ambulatory Visit (HOSPITAL_COMMUNITY)
Admission: RE | Admit: 2018-06-18 | Discharge: 2018-06-18 | Disposition: A | Discharge status: Home / Self Care | Payer: PRIVATE HEALTH INSURANCE | Source: Ambulatory Visit | Attending: Urology | Admitting: Urology

## 2018-06-18 ENCOUNTER — Encounter (HOSPITAL_COMMUNITY): Payer: Self-pay

## 2018-06-18 ENCOUNTER — Ambulatory Visit (HOSPITAL_COMMUNITY)
Admission: RE | Admit: 2018-06-18 | Discharge: 2018-06-18 | Disposition: A | Discharge status: Home / Self Care | Payer: PRIVATE HEALTH INSURANCE | Source: Ambulatory Visit | Attending: Radiology | Admitting: Radiology

## 2018-06-18 ENCOUNTER — Observation Stay (HOSPITAL_COMMUNITY)
Admission: RE | Admit: 2018-06-18 | Discharge: 2018-06-19 | Disposition: A | Discharge status: Home / Self Care | Payer: PRIVATE HEALTH INSURANCE | Source: Ambulatory Visit | Attending: Urology | Admitting: Urology

## 2018-06-18 ENCOUNTER — Other Ambulatory Visit: Payer: Self-pay

## 2018-06-18 DIAGNOSIS — R10A0 Flank pain, unspecified side: Secondary | ICD-10-CM | POA: Diagnosis present

## 2018-06-18 DIAGNOSIS — R109 Unspecified abdominal pain: Secondary | ICD-10-CM

## 2018-06-18 DIAGNOSIS — N200 Calculus of kidney: Secondary | ICD-10-CM

## 2018-06-18 DIAGNOSIS — N132 Hydronephrosis with renal and ureteral calculous obstruction: Secondary | ICD-10-CM | POA: Diagnosis not present

## 2018-06-18 DIAGNOSIS — N201 Calculus of ureter: Secondary | ICD-10-CM

## 2018-06-18 HISTORY — DX: Unspecified abdominal pain: R10.9

## 2018-06-18 HISTORY — PX: IR URETERAL STENT RIGHT NEW ACCESS W/O SEP NEPHROSTOMY CATH: IMG6076

## 2018-06-18 LAB — CBC WITH DIFFERENTIAL/PLATELET
Basophils Absolute: 0 10*3/uL (ref 0.0–0.1)
Basophils Relative: 1 %
EOS ABS: 0.1 10*3/uL (ref 0.0–0.7)
EOS PCT: 2 %
HCT: 38.5 % (ref 36.0–46.0)
Hemoglobin: 13.2 g/dL (ref 12.0–15.0)
LYMPHS PCT: 34 %
Lymphs Abs: 2.6 10*3/uL (ref 0.7–4.0)
MCH: 29.3 pg (ref 26.0–34.0)
MCHC: 34.3 g/dL (ref 30.0–36.0)
MCV: 85.4 fL (ref 78.0–100.0)
MONO ABS: 0.5 10*3/uL (ref 0.1–1.0)
MONOS PCT: 6 %
Neutro Abs: 4.4 10*3/uL (ref 1.7–7.7)
Neutrophils Relative %: 57 %
PLATELETS: 266 10*3/uL (ref 150–400)
RBC: 4.51 MIL/uL (ref 3.87–5.11)
RDW: 12 % (ref 11.5–15.5)
WBC: 7.7 10*3/uL (ref 4.0–10.5)

## 2018-06-18 LAB — BASIC METABOLIC PANEL
Anion gap: 9 (ref 5–15)
BUN: 19 mg/dL (ref 6–20)
CO2: 24 mmol/L (ref 22–32)
CREATININE: 0.58 mg/dL (ref 0.44–1.00)
Calcium: 9.5 mg/dL (ref 8.9–10.3)
Chloride: 106 mmol/L (ref 98–111)
GFR calc Af Amer: >60 mL/min (ref >60)
GFR calc non Af Amer: >60 mL/min (ref >60)
GLUCOSE: 146 mg/dL — AB (ref 70–99)
Potassium: 3.6 mmol/L (ref 3.5–5.1)
SODIUM: 139 mmol/L (ref 135–145)

## 2018-06-18 LAB — PROTIME-INR
INR: 0.97
PROTHROMBIN TIME: 12.8 s (ref 11.4–15.2)

## 2018-06-18 MED ORDER — CEFAZOLIN SODIUM-DEXTROSE 2-4 GM/100ML-% IV SOLN
2.0000 g | INTRAVENOUS | Status: AC
  Administered 2018-06-18: 2 g via INTRAVENOUS

## 2018-06-18 MED ORDER — IOPAMIDOL (ISOVUE-300) INJECTION 61%
50.0000 mL | Freq: Once | INTRAVENOUS | Status: AC | PRN
  Administered 2018-06-18: 10 mL

## 2018-06-18 MED ORDER — LIDOCAINE HCL 1 % IJ SOLN
INTRAMUSCULAR | Status: AC | PRN
  Administered 2018-06-18: 10 mL

## 2018-06-18 MED ORDER — ORAL CARE MOUTH RINSE: 15.0000 mL | Freq: Two times a day (BID) | OROMUCOSAL | Status: DC

## 2018-06-18 MED ORDER — MIDAZOLAM HCL 2 MG/2ML IJ SOLN
INTRAMUSCULAR | Status: AC
  Filled 2018-06-18: qty 4

## 2018-06-18 MED ORDER — DIPHENHYDRAMINE HCL 50 MG/ML IJ SOLN
12.5000 mg | Freq: Four times a day (QID) | INTRAMUSCULAR | Status: DC | PRN
  Filled 2018-06-18: qty 1

## 2018-06-18 MED ORDER — FENTANYL CITRATE (PF) 100 MCG/2ML IJ SOLN
25.0000 ug | Freq: Once | INTRAMUSCULAR | Status: AC
  Administered 2018-06-18: 100 ug via INTRAVENOUS

## 2018-06-18 MED ORDER — IOPAMIDOL (ISOVUE-300) INJECTION 61%
INTRAVENOUS | Status: AC
  Administered 2018-06-18: 10 mL
  Filled 2018-06-18: qty 50

## 2018-06-18 MED ORDER — ONDANSETRON HCL 4 MG/2ML IJ SOLN: 4.0000 mg | Freq: Four times a day (QID) | INTRAMUSCULAR | Status: DC | PRN

## 2018-06-18 MED ORDER — HYDROMORPHONE HCL 1 MG/ML IJ SOLN
1.0000 mg | Freq: Once | INTRAMUSCULAR | Status: AC
  Administered 2018-06-18: 1 mg via INTRAVENOUS
  Filled 2018-06-18: qty 1

## 2018-06-18 MED ORDER — DIPHENHYDRAMINE HCL 12.5 MG/5ML PO ELIX
12.5000 mg | ORAL_SOLUTION | Freq: Four times a day (QID) | ORAL | Status: DC | PRN
  Administered 2018-06-18: 12.5 mg via ORAL
  Filled 2018-06-18: qty 5

## 2018-06-18 MED ORDER — ONDANSETRON HCL 4 MG/2ML IJ SOLN
4.0000 mg | Freq: Once | INTRAMUSCULAR | Status: AC
  Administered 2018-06-18: 4 mg via INTRAVENOUS
  Filled 2018-06-18: qty 2

## 2018-06-18 MED ORDER — ONDANSETRON HCL 4 MG/2ML IJ SOLN
4.0000 mg | INTRAMUSCULAR | Status: DC | PRN
  Administered 2018-06-18: 4 mg via INTRAVENOUS
  Filled 2018-06-18: qty 2

## 2018-06-18 MED ORDER — OXYBUTYNIN CHLORIDE 5 MG PO TABS
5.0000 mg | ORAL_TABLET | Freq: Three times a day (TID) | ORAL | Status: DC | PRN
Start: 2018-06-18 — End: 2018-06-19
  Filled 2018-06-18 (×2): qty 1

## 2018-06-18 MED ORDER — TAMSULOSIN HCL 0.4 MG PO CAPS
0.4000 mg | ORAL_CAPSULE | Freq: Every day | ORAL | Status: DC
Start: 2018-06-18 — End: 2018-06-18

## 2018-06-18 MED ORDER — SODIUM CHLORIDE 0.9 % IV SOLN
INTRAVENOUS | Status: DC
  Administered 2018-06-18 (×2): via INTRAVENOUS

## 2018-06-18 MED ORDER — NALOXONE HCL 0.4 MG/ML IJ SOLN: 0.4000 mg | INTRAMUSCULAR | Status: DC | PRN

## 2018-06-18 MED ORDER — NALOXONE HCL 0.4 MG/ML IJ SOLN
INTRAMUSCULAR | Status: AC
  Filled 2018-06-18: qty 1

## 2018-06-18 MED ORDER — DOCUSATE SODIUM 100 MG PO CAPS
100.0000 mg | ORAL_CAPSULE | Freq: Two times a day (BID) | ORAL | Status: DC
  Administered 2018-06-18 – 2018-06-19 (×2): 100 mg via ORAL
  Filled 2018-06-18 (×3): qty 1

## 2018-06-18 MED ORDER — ACETAMINOPHEN 10 MG/ML IV SOLN
1000.0000 mg | Freq: Once | INTRAVENOUS | Status: AC
  Administered 2018-06-18: 1000 mg via INTRAVENOUS
  Filled 2018-06-18 (×2): qty 100

## 2018-06-18 MED ORDER — SODIUM CHLORIDE 0.9 % IV SOLN
INTRAVENOUS | Status: DC
  Administered 2018-06-18 – 2018-06-19 (×2): via INTRAVENOUS

## 2018-06-18 MED ORDER — TAMSULOSIN HCL 0.4 MG PO CAPS
0.4000 mg | ORAL_CAPSULE | Freq: Every day | ORAL | Status: DC
  Administered 2018-06-18: 0.4 mg via ORAL
  Filled 2018-06-18: qty 1

## 2018-06-18 MED ORDER — SODIUM CHLORIDE 0.9% FLUSH: 9.0000 mL | INTRAVENOUS | Status: DC | PRN

## 2018-06-18 MED ORDER — CEFAZOLIN SODIUM-DEXTROSE 2-4 GM/100ML-% IV SOLN
INTRAVENOUS | Status: AC
  Filled 2018-06-18: qty 100

## 2018-06-18 MED ORDER — FENTANYL CITRATE (PF) 100 MCG/2ML IJ SOLN
INTRAMUSCULAR | Status: AC
  Filled 2018-06-18: qty 2

## 2018-06-18 MED ORDER — FLUMAZENIL 0.5 MG/5ML IV SOLN
INTRAVENOUS | Status: AC
  Filled 2018-06-18: qty 5

## 2018-06-18 MED ORDER — FENTANYL CITRATE (PF) 100 MCG/2ML IJ SOLN
INTRAMUSCULAR | Status: AC | PRN
  Administered 2018-06-18: 25 ug via INTRAVENOUS
  Administered 2018-06-18 (×2): 50 ug via INTRAVENOUS

## 2018-06-18 MED ORDER — MIDAZOLAM HCL 2 MG/2ML IJ SOLN
INTRAMUSCULAR | Status: AC | PRN
  Administered 2018-06-18: 1 mg via INTRAVENOUS
  Administered 2018-06-18: 0.5 mg via INTRAVENOUS
  Administered 2018-06-18: 1 mg via INTRAVENOUS

## 2018-06-18 MED ORDER — LIDOCAINE HCL 1 % IJ SOLN
INTRAMUSCULAR | Status: AC
  Administered 2018-06-18: 17:00:00
  Filled 2018-06-18: qty 20

## 2018-06-18 MED ORDER — ACETAMINOPHEN 325 MG PO TABS
650.0000 mg | ORAL_TABLET | ORAL | Status: DC | PRN
  Administered 2018-06-18 – 2018-06-19 (×2): 650 mg via ORAL
  Filled 2018-06-18 (×2): qty 2

## 2018-06-18 MED ORDER — HYDROMORPHONE 1 MG/ML IV SOLN
INTRAVENOUS | Status: DC
  Administered 2018-06-18: 30 mg via INTRAVENOUS
  Administered 2018-06-19: 1.2 mg via INTRAVENOUS
  Administered 2018-06-19: 2.6 mg via INTRAVENOUS
  Administered 2018-06-19: 0.3 mg via INTRAVENOUS
  Filled 2018-06-18: qty 30

## 2018-06-18 MED ORDER — FENTANYL CITRATE (PF) 100 MCG/2ML IJ SOLN
INTRAMUSCULAR | Status: AC
  Filled 2018-06-18: qty 4

## 2018-06-18 NOTE — H&P (Signed)
Referring Physician(s): Kathie Rhodes  Supervising Physician: Daryll Brod  Patient Status:  [facility] OP  Chief Complaint:  Right kidney stone/flank pain  Subjective: Patient familiar to IR service from prior pelvic abscess drain placement in 2008.  She has a history of intermittent right-sided abdominal discomfort and underwent CT scan in May of this year which revealed a 1.6 cm stone in the right renal pelvis with mild right-sided hydroureteronephrosis.  She presents today for a right nephrostomy/nephroureteral catheter placement prior to planned nephrolithotomy on 7/15.  She currently denies fever, headache, chest pain, dyspnea, cough, nausea, vomiting, hematuria/dysuria.  Past Medical History:  Diagnosis Date  . Anxiety   . Colon polyp   . Diverticulosis   . Enteritis    History of in colonoscopy 2006  . GERD (gastroesophageal reflux disease)   . History of kidney stones   . Peripheral vascular disease (HCC)    right leg  . PONV (postoperative nausea and vomiting)    Past Surgical History:  Procedure Laterality Date  . APPENDECTOMY    . COLONOSCOPY  04/16/2018  . TUBAL LIGATION  21 years ago  . UPPER GI ENDOSCOPY        Allergies: Latex  Medications: Prior to Admission medications   Medication Sig Start Date End Date Taking? Authorizing Provider  HYDROcodone-acetaminophen (NORCO/VICODIN) 5-325 MG tablet Take 1 tablet by mouth every 6 (six) hours as needed for moderate pain.   Yes [provider]  traMADol (ULTRAM) 50 MG tablet Take 50 mg by mouth every 6 (six) hours as needed for pain. 05/21/18   [provider]     Vital Signs: Blood pressure 131/90, heart rate 84, temp 98.6, respirations 18, O2 sat 97% room air   Physical Exam awake, alert.  Chest clear to auscultation bilaterally.  Heart with regular rate and rhythm.  Abdomen soft, positive bowel sounds, mild right lateral abdominal/flank tenderness to palpation; no lower extremity  edema  Imaging: No results found.  Labs:  CBC: Recent Labs    04/15/18 1003 06/16/18 1436  WBC 10.0 10.2  HGB 13.7 13.6  HCT 39.4 38.6  PLT 305.0 329    COAGS: No results for input(s): INR, APTT in the last 8760 hours.  BMP: Recent Labs    04/15/18 1003  NA 138  K 3.8  CL 104  CO2 27  GLUCOSE 147*  BUN 13  CALCIUM 10.0  CREATININE 0.70    LIVER FUNCTION TESTS: Recent Labs    04/15/18 1003  BILITOT 0.5  AST 20  ALT 26  ALKPHOS 105  PROT 7.7  ALBUMIN 4.4    Assessment and Plan: Pt with history of intermittent right-sided abdominal discomfort who underwent CT scan in May of this year which revealed a 1.6 cm stone in the right renal pelvis with mild right-sided hydroureteronephrosis.  She presents today for  right nephrostomy/nephroureteral catheter placement prior to planned nephrolithotomy on 7/15.Risks and benefits of procedure were discussed with the patient/spouse including, but not limited to, infection, bleeding, significant bleeding causing loss or decrease in renal function or damage to adjacent structures.   All of the patient's questions were answered, patient is agreeable to proceed.  Consent signed and in chart.  LABS PENDING  Electronically Signed: D. Rowe Robert, PA-C 06/18/2018, 10:15 AM   I spent a total of 25 minutes at the the patient's bedside AND on the patient's hospital floor or unit, greater than 50% of which was counseling/coordinating care for right nephrostomy/nephroureteral catheter placement

## 2018-06-18 NOTE — Progress Notes (Signed)
Pt transferred / bed to room 1419; report given to [facility].

## 2018-06-18 NOTE — Procedures (Signed)
Right renal pelvis stone  S/p RT PCN ACCESS FOR OR PNL  No comp Stable EBL 0 Full report in pacs

## 2018-06-18 NOTE — H&P (Addendum)
Urology History and Physical  Admitting Attending: Kathie Rhodes, MD  Chief Complaint: Flank pain s/p IR nephrostomy tube placement  HPI: Haley Charles is a 47 y.o. female with recent right-sided flank pain. A CT scan was obtained on 04/27/18 which revealed a 17 mm stone located at the right UPJ. There was a 5 mm stone in the right lower pole with no left renal calculi. She elected for right PCNL, scheduled for 7/15.  Today, she underwent placement of right nephroureteral stent today with VIR. Her pain is unable to be controlled with PO medication. Post-procedure RUS reviewed and reveals mild right hydronephrosis without evidence of perinephric hematoma. Bladder decompressed.   Past Medical History: Past Medical History:  Diagnosis Date  . Anxiety   . Colon polyp   . Diverticulosis   . Enteritis    History of in colonoscopy 2006  . GERD (gastroesophageal reflux disease)   . History of kidney stones   . Peripheral vascular disease (HCC)    right leg  . PONV (postoperative nausea and vomiting)     Past Surgical History:  Past Surgical History:  Procedure Laterality Date  . APPENDECTOMY    . COLONOSCOPY  04/16/2018  . IR URETERAL STENT RIGHT NEW ACCESS W/O SEP NEPHROSTOMY CATH  06/18/2018  . TUBAL LIGATION  21 years ago  . UPPER GI ENDOSCOPY      Medication: Current Facility-Administered Medications  Medication Dose Route Frequency Provider Last Rate Last Dose  . 0.9 %  sodium chloride infusion   Intravenous Continuous Kathie Rhodes, MD      . acetaminophen (TYLENOL) tablet 650 mg  650 mg Oral Q4H PRN Kathie Rhodes, MD      . diphenhydrAMINE (BENADRYL) injection 12.5 mg  12.5 mg Intravenous Q6H PRN Kathie Rhodes, MD       Or  . diphenhydrAMINE (BENADRYL) 12.5 MG/5ML elixir 12.5 mg  12.5 mg Oral Q6H PRN Kathie Rhodes, MD      . docusate sodium (COLACE) capsule 100 mg  100 mg Oral BID Kathie Rhodes, MD      . fentaNYL (SUBLIMAZE) 100 MCG/2ML injection           . HYDROmorphone  (DILAUDID) 1 mg/mL PCA injection   Intravenous Q4H Kathie Rhodes, MD      . HYDROmorphone (DILAUDID) injection 1 mg  1 mg Intravenous Once Wood, Case M, MD      . lidocaine (XYLOCAINE) 1 % (with pres) injection           . naloxone (NARCAN) injection 0.4 mg  0.4 mg Intravenous PRN Kathie Rhodes, MD       And  . sodium chloride flush (NS) 0.9 % injection 9 mL  9 mL Intravenous PRN Kathie Rhodes, MD      . ondansetron (ZOFRAN) injection 4 mg  4 mg Intravenous Q4H PRN Kathie Rhodes, MD      . oxybutynin (DITROPAN) tablet 5 mg  5 mg Oral Q8H PRN Kathie Rhodes, MD      . tamsulosin (FLOMAX) capsule 0.4 mg  0.4 mg Oral QHS Wood, Case M, MD       Facility-Administered Medications Ordered in Other Encounters  Medication Dose Route Frequency Provider Last Rate Last Dose  . ceFAZolin (ANCEF) 2-4 GM/100ML-% IVPB           . fentaNYL (SUBLIMAZE) 100 MCG/2ML injection           . midazolam (VERSED) 2 MG/2ML injection  Allergies: Allergies  Allergen Reactions  . Latex Rash    Social History: Social History   Tobacco Use  . Smoking status: Former Smoker    Packs/day: 1.00    Years: 14.00    Pack years: 14.00    Types: Cigarettes    Last attempt to quit: 2009    Years since quitting: 10.5  . Smokeless tobacco: Never Used  Substance Use Topics  . Alcohol use: Yes    Comment: occassional  . Drug use: Not Currently    Family History Family History  Problem Relation Age of Onset  . Colon polyps Mother   . Esophagitis Mother   . Esophageal cancer Mother   . CAD Father   . Hypertension Father   . Diabetes Mellitus II Father   . Lung cancer Father   . Rectal cancer Neg Hx   . Stomach cancer Neg Hx   . Colon cancer Neg Hx     Review of Systems 10 systems were reviewed and are negative except as noted specifically in the HPI.  Objective   Vital signs in last 24 hours: BP (!) 145/73   Pulse 95   Temp 98.6 F (37 C) (Oral)   Resp 16   SpO2 91%   Physical  Exam General: moderate distress, A&O, appropriate HEENT: [place]/AT, EOMI, MMM Pulmonary: Normal work of breathing Cardiovascular: HDS, adequate peripheral perfusion Abdomen: Soft, NTTP, nondistended GU: Right nephroureteral stent in place to drainage with no urine in bag. Voided urine clear. Extremities: warm and well perfused Neuro: Appropriate, no focal neurological deficits  Most Recent Labs: Lab Results  Component Value Date   WBC 7.7 06/18/2018   HGB 13.2 06/18/2018   HCT 38.5 06/18/2018   PLT 266 06/18/2018    Lab Results  Component Value Date   NA 139 06/18/2018   K 3.6 06/18/2018   CL 106 06/18/2018   CO2 24 06/18/2018   BUN 19 06/18/2018   CREATININE 0.58 06/18/2018   CALCIUM 9.5 06/18/2018    Lab Results  Component Value Date   INR 0.97 06/18/2018     IMAGING: US Renal  Result Date: 06/18/2018 CLINICAL DATA:  Right flank pain status post right nephrostomy and ureteral stent placement. EXAM: RENAL / URINARY TRACT ULTRASOUND COMPLETE COMPARISON:  CT scan of Apr 27, 2018. FINDINGS: Right Kidney: Length: 11.1 cm. Echogenicity within normal limits. No mass visualized. Mild right hydronephrosis is noted. 1.3 cm calculus is noted in lower pole collecting system. Left Kidney: Length: 11.6 cm. Echogenicity within normal limits. No mass or hydronephrosis visualized. Bladder: Decompressed due to postvoid status. IMPRESSION: Mild right hydronephrosis is noted with 1.3 cm calculus seen in right intrarenal collecting system. Electronically Signed   By: Marijo Conception, M.D.   On: 06/18/2018 14:22   Ir Ureteral Stent Right New Access W/o Sep Nephrostomy Cath  Result Date: 06/18/2018 INDICATION: Obstructing right renal pelvis calculus EXAM: ULTRASOUND FLUOROSCOPIC RIGHT NEPHROSTOMY ACCESS (NEPHROLITHOTOMY ACCESS) COMPARISON:  04/27/2018 MEDICATIONS: ANCEF 2 G; The antibiotic was administered in an appropriate time frame prior to skin puncture. ANESTHESIA/SEDATION: Fentanyl 125 mcg  IV; Versed 2.5 mg IV Moderate Sedation Time:  14 MINUTES The patient was continuously monitored during the procedure by the interventional radiology nurse under my direct supervision. CONTRAST:  10 cc Isovue-300-administered into the collecting system(s) FLUOROSCOPY TIME:  Fluoroscopy Time: 1 minutes 12 seconds (18 mGy). COMPLICATIONS: None immediate. PROCEDURE: Informed written consent was obtained from the patient after a thorough discussion of the procedural risks,  benefits and alternatives. All questions were addressed. Maximal Sterile Barrier Technique was utilized including caps, mask, sterile gowns, sterile gloves, sterile drape, hand hygiene and skin antiseptic. A timeout was performed prior to the initiation of the procedure. Previous imaging reviewed. Patient position prone. Preliminary ultrasound performed. The right kidney containing the renal pelvis calculus was localized and marked. Under sterile conditions and local anesthesia, an 18 gauge 15 cm access needle was advanced over the twelfth rib under direct ultrasound into a dilated mid to lower pole calyx. Images obtained for documentation. Needle position confirmed with ultrasound. There was return of urine. Contrast injection confirms position within a posterior calyx. Renal pelvis stone noted. Bentson guidewire advanced. Kumpe catheter and Bentson guidewire advanced into the ureter and inferiorly into the bladder. Contrast injection confirms position in the bladder. Catheter secured with a silk suture. Nephrostomy access was capped externally followed by sterile dressing. No immediate complication. Patient tolerated the procedure well. IMPRESSION: Successful ultrasound and fluoroscopic right nephrostomy access as above Electronically Signed   By: Jerilynn Mages.  Shick M.D.   On: 06/18/2018 12:12    ------  Assessment:  47 y.o. female with right sided nephrolithiasis admitted for pain control s/p IR nephroureteral stent placement today.  Patient AFVSS.  Post-procedure RUS with mild right hydronephrosis. No evidence of perinephric hematoma.  Plan: - Admit to urology for pain control - AM H/H, BMP - Continue home meds - Plan for right PCNL on 7/15    I have seen and examined the patient and agree with Dr. Madelin Rear plan.  Briefly,  S: Uncontrolled pain after uncomplicated Rt neph tube plcement.  O: HR normal Imaging w/o hematomas Rt neph tube site w/o site reaction  A/P: Observe for pain control.

## 2018-06-18 NOTE — Progress Notes (Signed)
Spoke with Dr Karsten Ro in regards to pts pain levels secondary to stent procedure. Orders noted. Nephro tube placed to drain bag w/o drainage.

## 2018-06-19 DIAGNOSIS — N132 Hydronephrosis with renal and ureteral calculous obstruction: Secondary | ICD-10-CM | POA: Diagnosis not present

## 2018-06-19 LAB — BASIC METABOLIC PANEL
Anion gap: 6 (ref 5–15)
BUN: 17 mg/dL (ref 6–20)
CO2: 26 mmol/L (ref 22–32)
Calcium: 9.1 mg/dL (ref 8.9–10.3)
Chloride: 107 mmol/L (ref 98–111)
Creatinine, Ser: 0.73 mg/dL (ref 0.44–1.00)
GFR calc Af Amer: >60 mL/min (ref >60)
GFR calc non Af Amer: >60 mL/min (ref >60)
Glucose, Bld: 191 mg/dL — ABNORMAL HIGH (ref 70–99)
Potassium: 3.8 mmol/L (ref 3.5–5.1)
Sodium: 139 mmol/L (ref 135–145)

## 2018-06-19 LAB — HEMOGLOBIN AND HEMATOCRIT, BLOOD
HCT: 37.2 % (ref 36.0–46.0)
Hemoglobin: 12.7 g/dL (ref 12.0–15.0)

## 2018-06-19 LAB — HIV ANTIBODY (ROUTINE TESTING W REFLEX): HIV Screen 4th Generation wRfx: NONREACTIVE

## 2018-06-19 MED ORDER — OXYCODONE-ACETAMINOPHEN 5-325 MG PO TABS: 1.0000 | ORAL_TABLET | Freq: Four times a day (QID) | ORAL | 0 refills | Status: AC | PRN

## 2018-06-19 MED ORDER — SENNOSIDES-DOCUSATE SODIUM 8.6-50 MG PO TABS: 1.0000 | ORAL_TABLET | Freq: Two times a day (BID) | ORAL | 0 refills | Status: DC

## 2018-06-19 NOTE — Discharge Instructions (Signed)
1 - You may have urinary urgency (bladder spasms) and bloody urine on / off with nephrostomy in place. This is normal.  2 - Call MD or go to ER for fever >102, severe pain / nausea / vomiting not relieved by medications, or acute change in medical status

## 2018-06-19 NOTE — Discharge Summary (Signed)
Physician Discharge Summary  Patient ID: Haley Charles MRN: [id number] DOB/AGE: [birth year] 47 y.o.  Admit date: 06/18/2018 Discharge date: 06/19/2018  Admission Diagnoses: Flank Pain after Nephrostomy Placement  Discharge Diagnoses:  Active Problems:   Acute flank pain   Flank pain   Discharged Condition: good  Hospital Course: Pt underwent RIGHT percutnatoues nephrosotmy / nephroureteral stent placement on 06/18/18 in preparation for percutatneos stone surgery on 7/15. She had very difficult to control flank pain after procedure ans was admitted for observation / pain control. By the AM of 7/13, her pain is much improved, afebrile, maintaining PO nutrition, and felt to be adequate for discharge.   Consults: None  Significant Diagnostic Studies: labs: none  Treatments: right npehrostomy tube placement  Discharge Exam: Blood pressure (!) 145/73, pulse 95, temperature 98.6 F (37 C), temperature source Oral, resp. rate 17, SpO2 97 %. General appearance: alert, cooperative and appears stated age Eyes: negative Nose: Nares normal. Septum midline. Mucosa normal. No drainage or sinus tenderness., [place] CO2 monitor in place Throat: lips, mucosa, and tongue normal; teeth and gums normal Back: symmetric, no curvature. ROM normal. No CVA tenderness. Resp: non-labord on room air.  Cardio: Nl rate GI: soft, non-tender; bowel sounds normal; no masses,  no organomegaly and significant truncal obesity.  Extremities: extremities normal, atraumatic, no cyanosis or edema Lymph nodes: Cervical, supraclavicular, and axillary nodes normal. Neurologic: Grossly normal Incision/Wound: Right nephrostomy c/d/i with medium yellow urine. No hematomas / ecchymoses.   Disposition:   Discharge Instructions    Diet - low sodium heart healthy   Complete by:  As directed    Increase activity slowly   Complete by:  As directed         Signed: Kenyada Dosch 06/19/2018, 7:58 AM

## 2018-06-20 NOTE — Anesthesia Preprocedure Evaluation (Addendum)
Anesthesia Evaluation  Patient identified by MRN, date of birth, ID band Patient awake    Reviewed: Allergy & Precautions, NPO status , Patient's Chart, lab work & pertinent test results  Airway Mallampati: II  TM Distance: >3 FB Neck ROM: Full    Dental no notable dental hx. (+) Dental Advisory Given   Pulmonary former smoker,    Pulmonary exam normal        Cardiovascular + Peripheral Vascular Disease  Normal cardiovascular exam     Neuro/Psych Anxiety negative neurological ROS     GI/Hepatic Neg liver ROS, GERD  ,  Endo/Other  negative endocrine ROS  Renal/GU      Musculoskeletal negative musculoskeletal ROS (+)   Abdominal   Peds  Hematology negative hematology ROS (+)   Anesthesia Other Findings Day of surgery medications reviewed with the patient.  Reproductive/Obstetrics                            Anesthesia Physical Anesthesia Plan  ASA: II  Anesthesia Plan: General   Post-op Pain Management:    Induction: Intravenous  PONV Risk Score and Plan: 4 or greater and Ondansetron, Dexamethasone, Scopolamine patch - Pre-op and Diphenhydramine  Airway Management Planned: LMA  Additional Equipment:   Intra-op Plan:   Post-operative Plan: Extubation in OR  Informed Consent: I have reviewed the patients History and Physical, chart, labs and discussed the procedure including the risks, benefits and alternatives for the proposed anesthesia with the patient or authorized representative who has indicated his/her understanding and acceptance.   Dental advisory given  Plan Discussed with: CRNA and Anesthesiologist  Anesthesia Plan Comments:        Anesthesia Quick Evaluation

## 2018-06-20 NOTE — Discharge Instructions (Signed)

## 2018-06-20 NOTE — H&P (Signed)
HPI: Haley Charles is a 47 year-old female who returns today for right PCNL.  The problem is on the right side. Her symptoms include flank pain and back pain. Patient denies having groin pain, nausea, vomiting, fever, chills, and blood in urine. She first began experiencing symptoms 01/07/2018. This is her first kidney stone. She is currently having flank pain and back pain. She denies having groin pain, nausea, vomiting, fever, and chills. She has not caught a stone in her urine strainer since her symptoms began.   She has never had surgical treatment for calculi in the past.   05/07/18: The patient was experiencing right-sided flank pain and a CT scan was obtained on 04/27/18 which revealed a 17 mm stone located at the right UPJ with home of 1650. There was a 5 mm stone in the right lower pole with no left renal calculi. She has been having right flank pain. She had been doing a Keto diet and each time she ate something she would have pain in the flank. She therefore was evaluated by a gastroenterologist with no abnormality and no cholelithiasis. Her daughter has had kidney stones. She also has seen some hematuria. She has never passed a stone or had known kidney stones previously.  In preparation for her PCNL she underwent nephrostomy tube placement on 06/18/18.  She developed severe left flank pain after the procedure.  Renal ultrasound was performed which revealed a slight increase in fullness of the collecting system compared to her preprocedure ultrasound but no evidence of perinephric hematoma.  Her nephrostomy tube was opened to gravity, she was admitted to the hospital for pain control and over the ensuing 24 hours her pain improved and she was therefore discharged home.     ALLERGIES: None   MEDICATIONS: None   GU PSH: None   NON-GU PSH: Anesth, Repair Of Cervix Bilateral Tubal Ligation    GU PMH: Endometriosis, Unspec    NON-GU PMH: Anxiety Depression GERD    FAMILY HISTORY: 1  Daughter - Mother 2 sons - Mother   SOCIAL HISTORY: Marital Status: Single Preferred Language: English; Ethnicity: Not Hispanic Or Latino; Race: White Current Smoking Status: Patient does not smoke anymore.   Tobacco Use Assessment Completed: Used Tobacco in last 30 days? Social Drinker.  Drinks 1 caffeinated drink per day.    REVIEW OF SYSTEMS:    GU Review Female:   Patient denies frequent urination, hard to postpone urination, burning /pain with urination, get up at night to urinate, leakage of urine, stream starts and stops, trouble starting your stream, have to strain to urinate, and being pregnant.  Gastrointestinal (Upper):   Patient denies nausea, vomiting, and indigestion/ heartburn.  Gastrointestinal (Lower):   Patient denies diarrhea and constipation.  Constitutional:   Patient denies fever, night sweats, weight loss, and fatigue.  Skin:   Patient denies skin rash/ lesion and itching.  Eyes:   Patient denies blurred vision and double vision.  Ears/ Nose/ Throat:   Patient denies sore throat and sinus problems.  Hematologic/Lymphatic:   Patient denies swollen glands and easy bruising.  Cardiovascular:   Patient denies leg swelling and chest pains.  Respiratory:   Patient denies cough and shortness of breath.  Endocrine:   Patient denies excessive thirst.  Musculoskeletal:   Patient denies back pain and joint pain.  Neurological:   Patient denies headaches and dizziness.  Psychologic:   Patient denies anxiety and depression.   VITAL SIGNS:    Weight 191 lb / 86.64  kg  Height 62 in / 157.48 cm  BP 131/81 mmHg  Pulse 88 /min  BMI 34.9 kg/m   MULTI-SYSTEM PHYSICAL EXAMINATION:    Constitutional: Well-nourished. No physical deformities. Normally developed. Good grooming.  Neck: Neck symmetrical, not swollen. Normal tracheal position.  Respiratory: No labored breathing, no use of accessory muscles. Normal breath sounds.  Cardiovascular: Normal temperature, normal extremity  pulses, no swelling, no varicosities.   Lymphatic: No enlargement of neck, axillae, groin.  Skin: No paleness, no jaundice, no cyanosis. No lesion, no ulcer, no rash.  Neurologic / Psychiatric: Oriented to time, oriented to place, oriented to person. No depression, no anxiety, no agitation.  Gastrointestinal: No mass, no tenderness, no rigidity, non obese abdomen.  Eyes: Normal conjunctivae. Normal eyelids.  Ears, Nose, Mouth, and Throat: Left ear no scars, no lesions, no masses. Right ear no scars, no lesions, no masses. Nose no scars, no lesions, no masses. Normal hearing. Normal lips.  Musculoskeletal: Normal gait and station of head and neck.  Back: Nephrostomy tube is present in the right flank.  There is no flank ecchymoses.   PAST DATA REVIEWED:  Source Of History:  Patient  Lab Test Review:   BUN/Creatinine, Calcium  Records Review:   Previous Doctor Records, POC Tool  X-Ray Review: C.T. Abdomen/Pelvis: Reviewed Films. Reviewed Report. Discussed With Patient.    Notes:                     She was noted to have a normal serum creatinine in 5/19 of 0.7 with a normal serum calcium of 10.0.   PROCEDURES:          Urinalysis w/Scope Dipstick Dipstick Cont'd Micro  Color: Yellow Bilirubin: Neg WBC/hpf: 0 - 5/hpf  Appearance: Clear Ketones: Neg RBC/hpf: 3 - 10/hpf  Specific Gravity: 1.025 Blood: 2+ Bacteria: Rare (0-9/hpf)  pH: <=5.0 Protein: 1+ Cystals: NS (Not Seen)  Glucose: Neg Urobilinogen: 0.2 Casts: NS (Not Seen)    Nitrites: Neg Trichomonas: Not Present    Leukocyte Esterase: Neg Mucous: Present      Epithelial Cells: 0 - 5/hpf      Yeast: NS (Not Seen)      Sperm: Not Present    ASSESSMENT/PLAN:       ICD-10 Details  1 GU:   Renal calculus - N20.0 Right, Acute - I told her that I thought a PCNL would be the best way to render her stone free with the least number procedures. After discussing the procedure with her in full detail she has elected to proceed with a right  PCNL.  2   Hydronephrosis - N13.0 Right, Acute - Her hydronephrosis is clearly secondary to her large right UPJ stone.          Notes:   We discussed the management of urinary stones. These options include observation, ureteroscopy, shockwave lithotripsy, and PCNL. We discussed which options are relevant to these particular stones. We discussed the natural history of stones as well as the complications of untreated stones and the impact on quality of life without treatment as well as with each of the above listed treatments. We also discussed the efficacy of each treatment in its ability to clear the stone burden. With any of these management options I discussed the signs and symptoms of infection and the need for emergent treatment should these be experienced. For each option we discussed the ability of each procedure to clear the patient of their stone burden.   For  observation I described the risks which include but are not limited to silent renal damage, life-threatening infection, need for emergent surgery, failure to pass stone, and pain.   For ureteroscopy I described the risks which include heart attack, stroke, pulmonary embolus, death, bleeding, infection, damage to contiguous structures, positioning injury, ureteral stricture, ureteral avulsion, ureteral injury, need for ureteral stent, inability to perform ureteroscopy, need for an interval procedure, inability to clear stone burden, stent discomfort and pain.   For shockwave lithotripsy I described the risks which include arrhythmia, kidney contusion, kidney hemorrhage, need for transfusion, long-term risk of diabetes or hypertension, back discomfort, flank ecchymosis, flank abrasion, inability to break up stone, inability to pass stone fragments, Steinstrasse, infection associated with obstructing stones, need for different surgical procedure, need for repeat shockwave lithotripsy, and death.   For PCNL I described the risks including heart  attack, sure, pulmonary embolus, death, positioning injury, pneumothorax, hydrothorax, need for chest tube, inability to clear stone burden, renal laceration, arterial venous fistula or malformation, need for embolization of kidney, loss of kidney or renal function, need for repeat procedure, need for prolonged nephrostomy tube, ureteral avulsion, fistula.

## 2018-06-21 ENCOUNTER — Encounter (HOSPITAL_COMMUNITY): Payer: Self-pay

## 2018-06-21 ENCOUNTER — Ambulatory Visit (HOSPITAL_COMMUNITY): Payer: Self-pay | Admitting: Anesthesiology

## 2018-06-21 ENCOUNTER — Other Ambulatory Visit: Payer: Self-pay

## 2018-06-21 ENCOUNTER — Ambulatory Visit (HOSPITAL_COMMUNITY): Payer: Self-pay

## 2018-06-21 ENCOUNTER — Encounter (HOSPITAL_COMMUNITY)
Admission: RE | Disposition: A | Discharge status: Home / Self Care | Payer: Self-pay | Source: Ambulatory Visit | Attending: Urology

## 2018-06-21 ENCOUNTER — Ambulatory Visit (HOSPITAL_COMMUNITY)
Admission: RE | Admit: 2018-06-21 | Discharge: 2018-06-22 | Disposition: A | Discharge status: Home / Self Care | Payer: Self-pay | Source: Ambulatory Visit | Attending: Urology | Admitting: Urology

## 2018-06-21 DIAGNOSIS — N200 Calculus of kidney: Secondary | ICD-10-CM

## 2018-06-21 DIAGNOSIS — N132 Hydronephrosis with renal and ureteral calculous obstruction: Secondary | ICD-10-CM | POA: Insufficient documentation

## 2018-06-21 DIAGNOSIS — Z87891 Personal history of nicotine dependence: Secondary | ICD-10-CM | POA: Insufficient documentation

## 2018-06-21 DIAGNOSIS — I739 Peripheral vascular disease, unspecified: Secondary | ICD-10-CM | POA: Insufficient documentation

## 2018-06-21 HISTORY — PX: NEPHROLITHOTOMY: SHX5134

## 2018-06-21 HISTORY — DX: Calculus of kidney: N20.0

## 2018-06-21 SURGERY — NEPHROLITHOTOMY PERCUTANEOUS
Anesthesia: General | Laterality: Right

## 2018-06-21 MED ORDER — LIDOCAINE 2% (20 MG/ML) 5 ML SYRINGE
INTRAMUSCULAR | Status: AC
  Filled 2018-06-21: qty 5

## 2018-06-21 MED ORDER — ACETAMINOPHEN 10 MG/ML IV SOLN
1000.0000 mg | Freq: Once | INTRAVENOUS | Status: AC
  Administered 2018-06-21: 1000 mg via INTRAVENOUS
  Filled 2018-06-21: qty 100

## 2018-06-21 MED ORDER — ACETAMINOPHEN 325 MG PO TABS: 650.0000 mg | ORAL_TABLET | ORAL | Status: DC | PRN

## 2018-06-21 MED ORDER — DIPHENHYDRAMINE HCL 50 MG/ML IJ SOLN
25.0000 mg | Freq: Four times a day (QID) | INTRAMUSCULAR | Status: DC | PRN
  Administered 2018-06-21 – 2018-06-22 (×2): 25 mg via INTRAVENOUS
  Filled 2018-06-21 (×2): qty 1

## 2018-06-21 MED ORDER — ONDANSETRON HCL 4 MG/2ML IJ SOLN: 4.0000 mg | INTRAMUSCULAR | Status: DC | PRN

## 2018-06-21 MED ORDER — SUGAMMADEX SODIUM 200 MG/2ML IV SOLN
INTRAVENOUS | Status: DC | PRN
  Administered 2018-06-21: 350 mg via INTRAVENOUS

## 2018-06-21 MED ORDER — HYDROMORPHONE HCL 1 MG/ML IJ SOLN
0.5000 mg | INTRAMUSCULAR | Status: DC | PRN
  Administered 2018-06-21 (×2): 0.5 mg via INTRAVENOUS
  Filled 2018-06-21 (×2): qty 1

## 2018-06-21 MED ORDER — DEXAMETHASONE SODIUM PHOSPHATE 10 MG/ML IJ SOLN
INTRAMUSCULAR | Status: AC
  Filled 2018-06-21: qty 1

## 2018-06-21 MED ORDER — PROPOFOL 10 MG/ML IV BOLUS
INTRAVENOUS | Status: DC | PRN
  Administered 2018-06-21: 180 mg via INTRAVENOUS

## 2018-06-21 MED ORDER — PHENYLEPHRINE 40 MCG/ML (10ML) SYRINGE FOR IV PUSH (FOR BLOOD PRESSURE SUPPORT)
PREFILLED_SYRINGE | INTRAVENOUS | Status: DC | PRN
  Administered 2018-06-21: 80 ug via INTRAVENOUS

## 2018-06-21 MED ORDER — FENTANYL CITRATE (PF) 100 MCG/2ML IJ SOLN
25.0000 ug | INTRAMUSCULAR | Status: DC | PRN
  Administered 2018-06-21 (×3): 50 ug via INTRAVENOUS

## 2018-06-21 MED ORDER — ONDANSETRON HCL 4 MG/2ML IJ SOLN
INTRAMUSCULAR | Status: DC | PRN
  Administered 2018-06-21: 4 mg via INTRAVENOUS

## 2018-06-21 MED ORDER — MIDAZOLAM HCL 5 MG/5ML IJ SOLN
INTRAMUSCULAR | Status: DC | PRN
  Administered 2018-06-21: 2 mg via INTRAVENOUS

## 2018-06-21 MED ORDER — FENTANYL CITRATE (PF) 100 MCG/2ML IJ SOLN
INTRAMUSCULAR | Status: AC
  Filled 2018-06-21: qty 2

## 2018-06-21 MED ORDER — SODIUM CHLORIDE 0.9 % IR SOLN
Status: DC | PRN
  Administered 2018-06-21: 12000 mL

## 2018-06-21 MED ORDER — OXYBUTYNIN CHLORIDE 5 MG PO TABS
5.0000 mg | ORAL_TABLET | Freq: Three times a day (TID) | ORAL | Status: DC | PRN
  Administered 2018-06-21 – 2018-06-22 (×2): 5 mg via ORAL
  Filled 2018-06-21 (×2): qty 1

## 2018-06-21 MED ORDER — IOHEXOL 300 MG/ML  SOLN
INTRAMUSCULAR | Status: DC | PRN
  Administered 2018-06-21: 10 mL

## 2018-06-21 MED ORDER — HYDROMORPHONE HCL 1 MG/ML IJ SOLN
1.0000 mg | INTRAMUSCULAR | Status: DC | PRN
  Administered 2018-06-21: 2 mg via INTRAVENOUS
  Administered 2018-06-21: 1 mg via INTRAVENOUS
  Administered 2018-06-21 – 2018-06-22 (×3): 2 mg via INTRAVENOUS
  Filled 2018-06-21 (×4): qty 2
  Filled 2018-06-21: qty 1

## 2018-06-21 MED ORDER — ONDANSETRON HCL 4 MG/2ML IJ SOLN
INTRAMUSCULAR | Status: AC
  Filled 2018-06-21: qty 2

## 2018-06-21 MED ORDER — LACTATED RINGERS IV SOLN
INTRAVENOUS | Status: DC | PRN
  Administered 2018-06-21: 07:00:00 via INTRAVENOUS

## 2018-06-21 MED ORDER — CEFAZOLIN SODIUM-DEXTROSE 2-4 GM/100ML-% IV SOLN
2.0000 g | Freq: Three times a day (TID) | INTRAVENOUS | Status: AC
  Administered 2018-06-21 (×2): 2 g via INTRAVENOUS
  Filled 2018-06-21 (×3): qty 100

## 2018-06-21 MED ORDER — FENTANYL CITRATE (PF) 250 MCG/5ML IJ SOLN
INTRAMUSCULAR | Status: DC | PRN
  Administered 2018-06-21: 100 ug via INTRAVENOUS

## 2018-06-21 MED ORDER — DEXAMETHASONE SODIUM PHOSPHATE 10 MG/ML IJ SOLN
INTRAMUSCULAR | Status: DC | PRN
  Administered 2018-06-21: 10 mg via INTRAVENOUS

## 2018-06-21 MED ORDER — CEFAZOLIN SODIUM-DEXTROSE 2-4 GM/100ML-% IV SOLN
2.0000 g | Freq: Once | INTRAVENOUS | Status: AC
  Administered 2018-06-21: 2 g via INTRAVENOUS
  Filled 2018-06-21: qty 100

## 2018-06-21 MED ORDER — PHENYLEPHRINE 40 MCG/ML (10ML) SYRINGE FOR IV PUSH (FOR BLOOD PRESSURE SUPPORT)
PREFILLED_SYRINGE | INTRAVENOUS | Status: AC
  Filled 2018-06-21: qty 10

## 2018-06-21 MED ORDER — OXYCODONE-ACETAMINOPHEN 5-325 MG PO TABS
1.0000 | ORAL_TABLET | ORAL | Status: DC | PRN
  Administered 2018-06-21: 1 via ORAL
  Filled 2018-06-21: qty 1
  Filled 2018-06-21: qty 2

## 2018-06-21 MED ORDER — SODIUM CHLORIDE 0.9 % IV SOLN
INTRAVENOUS | Status: DC
  Administered 2018-06-21 – 2018-06-22 (×3): via INTRAVENOUS

## 2018-06-21 MED ORDER — SUGAMMADEX SODIUM 200 MG/2ML IV SOLN
INTRAVENOUS | Status: AC
  Filled 2018-06-21: qty 4

## 2018-06-21 MED ORDER — ROCURONIUM BROMIDE 10 MG/ML (PF) SYRINGE
PREFILLED_SYRINGE | INTRAVENOUS | Status: DC | PRN
  Administered 2018-06-21: 10 mg via INTRAVENOUS
  Administered 2018-06-21: 30 mg via INTRAVENOUS

## 2018-06-21 MED ORDER — PROPOFOL 10 MG/ML IV BOLUS
INTRAVENOUS | Status: AC
  Filled 2018-06-21: qty 40

## 2018-06-21 MED ORDER — SCOPOLAMINE 1 MG/3DAYS TD PT72
1.0000 | MEDICATED_PATCH | TRANSDERMAL | Status: DC
  Administered 2018-06-21: 1.5 mg via TRANSDERMAL
  Filled 2018-06-21: qty 1

## 2018-06-21 MED ORDER — LIDOCAINE 2% (20 MG/ML) 5 ML SYRINGE
INTRAMUSCULAR | Status: DC | PRN
  Administered 2018-06-21: 60 mg via INTRAVENOUS

## 2018-06-21 MED ORDER — PROMETHAZINE HCL 25 MG/ML IJ SOLN: 6.2500 mg | INTRAMUSCULAR | Status: DC | PRN

## 2018-06-21 MED ORDER — MIDAZOLAM HCL 2 MG/2ML IJ SOLN
INTRAMUSCULAR | Status: AC
  Filled 2018-06-21: qty 2

## 2018-06-21 SURGICAL SUPPLY — 46 items
APPLICATOR SURGIFLO ENDO (HEMOSTASIS) ×2 IMPLANT
BAG URINE DRAINAGE (UROLOGICAL SUPPLIES) IMPLANT
BASKET ZERO TIP NITINOL 2.4FR (BASKET) IMPLANT
BENZOIN TINCTURE PRP APPL 2/3 (GAUZE/BANDAGES/DRESSINGS) ×2 IMPLANT
BLADE SURG 15 STRL LF DISP TIS (BLADE) ×1 IMPLANT
BLADE SURG 15 STRL SS (BLADE) ×1
CATH FOLEY 2W COUNCIL 20FR 5CC (CATHETERS) IMPLANT
CATH FOLEY 2WAY SLVR  5CC 18FR (CATHETERS)
CATH FOLEY 2WAY SLVR 5CC 18FR (CATHETERS) IMPLANT
CATH IMAGER II 65CM (CATHETERS) IMPLANT
CATH X-FORCE N30 NEPHROSTOMY (TUBING) ×2 IMPLANT
COVER SURGICAL LIGHT HANDLE (MISCELLANEOUS) ×2 IMPLANT
DRAPE C-ARM 42X120 X-RAY (DRAPES) ×2 IMPLANT
DRAPE LINGEMAN PERC (DRAPES) ×2 IMPLANT
DRAPE SURG IRRIG POUCH 19X23 (DRAPES) ×2 IMPLANT
DRSG PAD ABDOMINAL 8X10 ST (GAUZE/BANDAGES/DRESSINGS) ×4 IMPLANT
DRSG TEGADERM 6X8 (GAUZE/BANDAGES/DRESSINGS) ×2 IMPLANT
DRSG TEGADERM 8X12 (GAUZE/BANDAGES/DRESSINGS) IMPLANT
FIBER LASER TRAC TIP (UROLOGICAL SUPPLIES) IMPLANT
FLOSEAL 10ML (HEMOSTASIS) ×2 IMPLANT
GAUZE SPONGE 4X4 12PLY STRL (GAUZE/BANDAGES/DRESSINGS) ×2 IMPLANT
GLOVE BIOGEL M 8.0 STRL (GLOVE) IMPLANT
GOWN STRL REUS W/TWL XL LVL3 (GOWN DISPOSABLE) ×2 IMPLANT
GUIDEWIRE AMPLAZ .035X145 (WIRE) ×4 IMPLANT
GUIDEWIRE STR DUAL SENSOR (WIRE) IMPLANT
HOLDER FOLEY CATH W/STRAP (MISCELLANEOUS) ×2 IMPLANT
KIT BASIN OR (CUSTOM PROCEDURE TRAY) ×2 IMPLANT
MANIFOLD NEPTUNE II (INSTRUMENTS) ×2 IMPLANT
NS IRRIG 1000ML POUR BTL (IV SOLUTION) IMPLANT
PACK CYSTO (CUSTOM PROCEDURE TRAY) ×2 IMPLANT
PROBE LITHOCLAST ULTRA 3.8X403 (UROLOGICAL SUPPLIES) IMPLANT
PROBE PNEUMATIC 1.0MMX570MM (UROLOGICAL SUPPLIES) IMPLANT
SHEATH PEELAWAY SET 9 (SHEATH) ×2 IMPLANT
SPONGE LAP 4X18 RFD (DISPOSABLE) ×2 IMPLANT
STENT CONTOUR 6FRX24X.038 (STENTS) ×2 IMPLANT
STONE CATCHER W/TUBE ADAPTER (UROLOGICAL SUPPLIES) IMPLANT
SURGIFLO W/THROMBIN 8M KIT (HEMOSTASIS) IMPLANT
SUT MNCRL AB 4-0 PS2 18 (SUTURE) ×2 IMPLANT
SUT SILK 2 0 30  PSL (SUTURE)
SUT SILK 2 0 30 PSL (SUTURE) IMPLANT
SYR 10ML LL (SYRINGE) ×2 IMPLANT
SYR 20CC LL (SYRINGE) ×4 IMPLANT
TOWEL OR 17X26 10 PK STRL BLUE (TOWEL DISPOSABLE) ×2 IMPLANT
TOWEL OR NON WOVEN STRL DISP B (DISPOSABLE) ×2 IMPLANT
TRAY FOLEY MTR SLVR 16FR STAT (SET/KITS/TRAYS/PACK) IMPLANT
TUBING CONNECTING 10 (TUBING) ×4 IMPLANT

## 2018-06-21 NOTE — Op Note (Signed)
PATIENT:  Haley Charles  PRE-OPERATIVE DIAGNOSIS:  Right renal calculus  POST-OPERATIVE DIAGNOSIS: Same  PROCEDURE: 1. Percutaneous nephrostomy sheath placement. 2.  Right percutaneous nephrolithotomy (2.1 cm.) 3. Antegrade double-J stent placement.  SURGEON:  Claybon Jabs  INDICATION: Haley Charles is a 47 year old female who developed right-sided flank pain initially in 1/19.  She has no prior history of kidney stones.  She was seen and evaluated and on CT scan was found to have a large stone occupying the right renal pelvis at the level of the UPJ with Hounsfield units of 1650.  She had a nephrostomy tube placed previously and presents today for right PCNL.  ANESTHESIA:  General  EBL:  Minimal  DRAINS: 6 French, 24 cm double-J stent in the right ureter  LOCAL MEDICATIONS USED:  None  SPECIMEN: Stone given the patient  Description of procedure: After informed consent the patient was taken to the operating room and administered general endotracheal anesthesia. Once fully anesthetized the patient had an 49 French Foley catheter placed It was then moved from the stretcher onto the operating room table in a prone position with bony prominences padded and chest pads in place. The flank with exiting nephrostomy catheter was then sterilely prepped and draped in standard fashion. An official timeout was then performed.  Using the existing nephrostomy catheter access I passed a 0.038 inch floppy tipped guidewire down the ureter into the bladder under fluoroscopy.  This was left in place and the nephrostomy catheter was removed.  A transverse incision was made over the guidewire and a peel-away coaxial catheter was then passed over the guidewire and down the ureter under fluoroscopy.  The inner portion of the coaxial system was then removed and a second guidewire was passed through this catheter and down the ureter into the bladder under fluoroscopy.  The coaxial catheter was then removed and one of  the guidewires was secured to the drape as a safety guidewire and the second guidewire was used as a working guidewire.  The NephroMax nephrostomy dilating balloon was then passed over the working guidewire into the area of the renal pelvis under fluoroscopy.  It was then inflated using dilute contrast under fluoroscopy until the balloon was fully inflated.  I then passed the 28 French nephrostomy access sheath over the balloon into the area of the renal pelvis under fluoroscopy and then deflated the balloon and removed the dilating balloon.  The 43 French rigid nephroscope was then passed under direct vision through the nephrostomy access sheath.  Clotted blood was evacuated and the stone was identified.  The ultrasonic lithotripter was then passed through the nephroscope and it was used to fragment the stone.  As the stone fragmented into smaller portions these were then intermittently grasped using 2 prong graspers.  I was able to remove all visible stone from the renal pelvis using these 2 instruments.  I then switched to the flexible cystoscope and inspected the lower pole calyx but found no stone in this location.  The middle and upper poles were also inspected and I could identify no stone.  The scope was advanced down alongside the guidewires into the UPJ region and no stones were noted in the ureter.  I switched back to the rigid nephroscope and backloaded the guidewire through the scope.  The double-J stent was passed over the guidewire and down into the bladder and as I remove the guidewire good curl was noted in the bladder.  Good curl was noted in  the renal pelvis.  The stent was grasped with 2 prong graspers while I removed the safety guidewire and then positioned in the renal pelvis.  I then measured the distance from the level of the renal parenchyma to the end of the access sheath and marked this corresponding distance on the laparoscopic introducer.  I then evacuated all fluid from the kidney  with the nephroscope and remove the nephroscope.  I passed the laparoscopic introducer to the level previously measured and began injecting FloSeal in the tract at the level of the renal parenchyma and on through the level of the parenchyma into the subcutaneous and perinephric tissue.  There was very little bleeding throughout the procedure and therefore I felt this could be performed "tubeless".  I therefore removed the access sheath and closed the skin with a running, subcuticular 4-0 Monocryl suture.  Sterile 4 x 4's and a Tegaderm were applied and the patient was awakened and taken to the recovery room in stable and satisfactory condition.  She tolerated the procedure well with no intraoperative complications.    PLAN OF CARE: Discharge to home after an overnight stay.  PATIENT DISPOSITION:  PACU - hemodynamically stable.

## 2018-06-21 NOTE — Anesthesia Procedure Notes (Signed)
Procedure Name: Intubation Date/Time: 06/21/2018 7:38 AM Performed by: Mitzie Na, CRNA Pre-anesthesia Checklist: Patient identified, Emergency Drugs available, Suction available, Patient being monitored and Timeout performed Patient Re-evaluated:Patient Re-evaluated prior to induction Oxygen Delivery Method: Circle system utilized Preoxygenation: Pre-oxygenation with 100% oxygen Induction Type: IV induction Ventilation: Mask ventilation without difficulty Laryngoscope Size: Mac and 3 Grade View: Grade I Tube type: Oral Tube size: 7.0 mm Number of attempts: 1 Airway Equipment and Method: Stylet Placement Confirmation: ETT inserted through vocal cords under direct vision,  positive ETCO2 and breath sounds checked- equal and bilateral Secured at: 23 cm Tube secured with: Tape Dental Injury: Teeth and Oropharynx as per pre-operative assessment

## 2018-06-21 NOTE — Progress Notes (Signed)
Patient ID: Haley Charles, female   DOB: 12-17-1970, 47 y.o.   MRN: [id number] Postoperatively she was seen and noted to be awake, alert and in no distress.  A VSS Abdomen soft Urine draining and slightly pink with no clots.  Doing well status post left PCNL.  We discussed the absence of a nephrostomy tube and the presence of a stent that will be removed in the office.  Plan to observe overnight. Foley catheter out in the morning. Anticipate discharge in a.m.

## 2018-06-21 NOTE — Anesthesia Postprocedure Evaluation (Signed)
Anesthesia Post Note  Patient: Haley Charles  Procedure(s) Performed: RIGHT NEPHROLITHOTOMY PERCUTANEOUS (Right )     Patient location during evaluation: PACU Anesthesia Type: General Level of consciousness: sedated Pain management: pain level controlled Vital Signs Assessment: post-procedure vital signs reviewed and stable Respiratory status: spontaneous breathing and respiratory function stable Cardiovascular status: stable Postop Assessment: no apparent nausea or vomiting Anesthetic complications: no    Last Vitals:  Vitals:   06/21/18 0930 06/21/18 0945  BP: 122/66 107/68  Pulse: 68 72  Resp: 17 18  Temp:    SpO2: 90% 92%    Last Pain:  Vitals:   06/21/18 0955  TempSrc:   PainSc: 6                  Dayton Kenley DANIEL

## 2018-06-21 NOTE — Progress Notes (Signed)
Report received from H. Holderness,RN. No change in assessment. Continue plan of care. Haley Charles 

## 2018-06-21 NOTE — Transfer of Care (Signed)
Immediate Anesthesia Transfer of Care Note  Patient: Haley Charles  Procedure(s) Performed: RIGHT NEPHROLITHOTOMY PERCUTANEOUS (Right )  Patient Location: PACU  Anesthesia Type:General  Level of Consciousness: awake, alert , oriented and patient cooperative  Airway & Oxygen Therapy: Patient Spontanous Breathing and Patient connected to face mask oxygen  Post-op Assessment: Report given to RN, Post -op Vital signs reviewed and stable and Patient moving all extremities X 4  Post vital signs: Reviewed and stable  Last Vitals:  Vitals Value Taken Time  BP 129/71 06/21/2018  8:46 AM  Temp    Pulse 79 06/21/2018  8:50 AM  Resp 19 06/21/2018  8:50 AM  SpO2 96 % 06/21/2018  8:50 AM  Vitals shown include unvalidated device data.  Last Pain:  Vitals:   06/21/18 0610  TempSrc:   PainSc: 0-No pain         Complications: No apparent anesthesia complications

## 2018-06-22 ENCOUNTER — Encounter (HOSPITAL_COMMUNITY): Payer: Self-pay | Admitting: Urology

## 2018-06-22 MED ORDER — OXYCODONE HCL 10 MG PO TABS: 10.0000 mg | ORAL_TABLET | ORAL | 0 refills | Status: DC | PRN

## 2018-06-22 MED ORDER — TOLTERODINE TARTRATE ER 4 MG PO CP24: 4.0000 mg | ORAL_CAPSULE | Freq: Every day | ORAL | 0 refills | Status: DC

## 2018-06-22 NOTE — Discharge Summary (Signed)
  Physician Discharge Summary      Patient ID: Haley Charles MRN: [id number] DOB/AGE: 1971/09/14 47 y.o.  Admit date: 06/21/2018 Discharge date: 06/22/2018  Admission Diagnoses: RIGHT URETEROPELVIC JUNCTION STONE  Discharge Diagnoses:  Active Problems:   Renal calculus, right   Discharged Condition: good  Hospital Course: She underwent an uncomplicated right PCNL and was observed overnight.  She was initially having moderate flank pain which has abated overnight.  She currently is is pain-free.  She has noted some bladder spasms secondary to her stent.  She was completely cleared of all stone at the time of her procedure.  She is tolerating a regular diet and is felt ready for discharge at this time.  Discharge Exam: Blood pressure 120/78, pulse 64, temperature 98.2 F (36.8 C), temperature source Oral, resp. rate 17, height 5\' 2"  (1.575 m), weight 90.9 kg (200 lb 4.8 oz), SpO2 95 %. General: Awake, alert and in no apparent distress. Chest: Normal respiratory effort. Cardiovascular: Regular rate and rhythm. Abdomen: Soft, nontender, nondistended.   Disposition: Discharge disposition: 01-Home or Self Care       Discharge Instructions    Discharge patient   Complete by:  As directed    Discharge disposition:  01-Home or Self Care   Discharge patient date:  06/22/2018     Allergies as of 06/22/2018      Reactions   Latex Rash      Medication List    TAKE these medications   Oxycodone HCl 10 MG Tabs Take 1 tablet (10 mg total) by mouth every 4 (four) hours as needed.   oxyCODONE-acetaminophen 5-325 MG tablet Commonly known as:  PERCOCET Take 1-2 tablets by mouth every 6 (six) hours as needed for moderate pain or severe pain.   senna-docusate 8.6-50 MG tablet Commonly known as:  Senokot-S Take 1 tablet by mouth 2 (two) times daily. While taking pain meds to prevent constipation   tolterodine 4 MG 24 hr capsule Commonly known as:  DETROL LA Take 1 capsule (4 mg  total) by mouth daily.      Follow-up Information    [facility] On 06/28/2018.   Why:  For your appiontment at 8:30 Contact information: [place]          Signed: Claybon Jabs 06/22/2018, 6:52 AM

## 2018-06-22 NOTE — Plan of Care (Signed)
  Problem: Education: Goal: Knowledge of General Education information will improve Outcome: Adequate for Discharge   Problem: Health Behavior/Discharge Planning: Goal: Ability to manage health-related needs will improve Outcome: Adequate for Discharge   Problem: Clinical Measurements: Goal: Ability to maintain clinical measurements within normal limits will improve Outcome: Adequate for Discharge Goal: Will remain free from infection Outcome: Adequate for Discharge Goal: Diagnostic test results will improve Outcome: Adequate for Discharge Goal: Respiratory complications will improve Outcome: Adequate for Discharge Goal: Cardiovascular complication will be avoided Outcome: Adequate for Discharge   Problem: Activity: Goal: Risk for activity intolerance will decrease Outcome: Adequate for Discharge   Problem: Nutrition: Goal: Adequate nutrition will be maintained Outcome: Adequate for Discharge   Problem: Coping: Goal: Level of anxiety will decrease Outcome: Adequate for Discharge   Problem: Elimination: Goal: Will not experience complications related to bowel motility Outcome: Adequate for Discharge Goal: Will not experience complications related to urinary retention Outcome: Adequate for Discharge   Problem: Pain Managment: Goal: General experience of comfort will improve Outcome: Adequate for Discharge   Problem: Skin Integrity: Goal: Risk for impaired skin integrity will decrease Outcome: Adequate for Discharge   Problem: Clinical Measurements: Goal: Postoperative complications will be avoided or minimized Outcome: Adequate for Discharge   Problem: Skin Integrity: Goal: Demonstration of wound healing without infection will improve Outcome: Adequate for Discharge

## 2018-06-23 ENCOUNTER — Emergency Department (HOSPITAL_COMMUNITY): Payer: PRIVATE HEALTH INSURANCE

## 2018-06-23 ENCOUNTER — Encounter (HOSPITAL_COMMUNITY): Payer: Self-pay | Admitting: Emergency Medicine

## 2018-06-23 ENCOUNTER — Observation Stay (HOSPITAL_COMMUNITY)
Admission: EM | Admit: 2018-06-23 | Discharge: 2018-06-25 | Disposition: A | Discharge status: Home / Self Care | Payer: Self-pay | Attending: Urology | Admitting: Urology

## 2018-06-23 DIAGNOSIS — R509 Fever, unspecified: Secondary | ICD-10-CM

## 2018-06-23 DIAGNOSIS — Z86010 Personal history of colonic polyps: Secondary | ICD-10-CM | POA: Insufficient documentation

## 2018-06-23 DIAGNOSIS — F419 Anxiety disorder, unspecified: Secondary | ICD-10-CM | POA: Insufficient documentation

## 2018-06-23 DIAGNOSIS — Z87891 Personal history of nicotine dependence: Secondary | ICD-10-CM | POA: Insufficient documentation

## 2018-06-23 DIAGNOSIS — I739 Peripheral vascular disease, unspecified: Secondary | ICD-10-CM | POA: Insufficient documentation

## 2018-06-23 DIAGNOSIS — Z91040 Latex allergy status: Secondary | ICD-10-CM | POA: Insufficient documentation

## 2018-06-23 DIAGNOSIS — Z79899 Other long term (current) drug therapy: Secondary | ICD-10-CM | POA: Insufficient documentation

## 2018-06-23 DIAGNOSIS — I700 Atherosclerosis of aorta: Secondary | ICD-10-CM | POA: Insufficient documentation

## 2018-06-23 DIAGNOSIS — R000 Tachycardia, unspecified: Secondary | ICD-10-CM | POA: Insufficient documentation

## 2018-06-23 DIAGNOSIS — N12 Tubulo-interstitial nephritis, not specified as acute or chronic: Secondary | ICD-10-CM | POA: Diagnosis present

## 2018-06-23 DIAGNOSIS — Q638 Other specified congenital malformations of kidney: Secondary | ICD-10-CM | POA: Insufficient documentation

## 2018-06-23 DIAGNOSIS — N10 Acute tubulo-interstitial nephritis: Principal | ICD-10-CM | POA: Insufficient documentation

## 2018-06-23 DIAGNOSIS — G8918 Other acute postprocedural pain: Secondary | ICD-10-CM | POA: Insufficient documentation

## 2018-06-23 DIAGNOSIS — K219 Gastro-esophageal reflux disease without esophagitis: Secondary | ICD-10-CM | POA: Insufficient documentation

## 2018-06-23 DIAGNOSIS — K5730 Diverticulosis of large intestine without perforation or abscess without bleeding: Secondary | ICD-10-CM | POA: Insufficient documentation

## 2018-06-23 DIAGNOSIS — R109 Unspecified abdominal pain: Secondary | ICD-10-CM

## 2018-06-23 HISTORY — DX: Tubulo-interstitial nephritis, not specified as acute or chronic: N12

## 2018-06-23 LAB — I-STAT BETA HCG BLOOD, ED (MC, WL, AP ONLY): I-stat hCG, quantitative: <5 m[IU]/mL (ref <5)

## 2018-06-23 LAB — CBC WITH DIFFERENTIAL/PLATELET
Basophils Absolute: 0 10*3/uL (ref 0.0–0.1)
Basophils Relative: 0 %
Eosinophils Absolute: 0.1 10*3/uL (ref 0.0–0.7)
Eosinophils Relative: 0 %
HCT: 39.1 % (ref 36.0–46.0)
Hemoglobin: 13.3 g/dL (ref 12.0–15.0)
Lymphocytes Relative: 11 %
Lymphs Abs: 1.7 10*3/uL (ref 0.7–4.0)
MCH: 29.5 pg (ref 26.0–34.0)
MCHC: 34 g/dL (ref 30.0–36.0)
MCV: 86.7 fL (ref 78.0–100.0)
Monocytes Absolute: 1.3 10*3/uL — ABNORMAL HIGH (ref 0.1–1.0)
Monocytes Relative: 8 %
Neutro Abs: 12.8 10*3/uL — ABNORMAL HIGH (ref 1.7–7.7)
Neutrophils Relative %: 81 %
Platelets: 290 10*3/uL (ref 150–400)
RBC: 4.51 MIL/uL (ref 3.87–5.11)
RDW: 12.3 % (ref 11.5–15.5)
WBC: 15.9 10*3/uL — ABNORMAL HIGH (ref 4.0–10.5)

## 2018-06-23 LAB — I-STAT CG4 LACTIC ACID, ED
Lactic Acid, Venous: 2.01 mmol/L (ref 0.5–1.9)
Lactic Acid, Venous: 2.01 mmol/L (ref 0.5–1.9)

## 2018-06-23 LAB — COMPREHENSIVE METABOLIC PANEL
ALT: 28 U/L (ref 0–44)
AST: 20 U/L (ref 15–41)
Albumin: 3.8 g/dL (ref 3.5–5.0)
Alkaline Phosphatase: 109 U/L (ref 38–126)
Anion gap: 13 (ref 5–15)
BUN: 17 mg/dL (ref 6–20)
CO2: 24 mmol/L (ref 22–32)
Calcium: 9.2 mg/dL (ref 8.9–10.3)
Chloride: 104 mmol/L (ref 98–111)
Creatinine, Ser: 1.07 mg/dL — ABNORMAL HIGH (ref 0.44–1.00)
GFR calc Af Amer: >60 mL/min (ref >60)
GFR calc non Af Amer: >60 mL/min (ref >60)
Glucose, Bld: 214 mg/dL — ABNORMAL HIGH (ref 70–99)
Potassium: 3.6 mmol/L (ref 3.5–5.1)
Sodium: 141 mmol/L (ref 135–145)
Total Bilirubin: 0.4 mg/dL (ref 0.3–1.2)
Total Protein: 7.4 g/dL (ref 6.5–8.1)

## 2018-06-23 LAB — URINALYSIS, ROUTINE W REFLEX MICROSCOPIC
BILIRUBIN URINE: NEGATIVE
GLUCOSE, UA: 150 mg/dL — AB
KETONES UR: NEGATIVE mg/dL
NITRITE: NEGATIVE
PH: 6 (ref 5.0–8.0)
PROTEIN: 100 mg/dL — AB
Specific Gravity, Urine: 1.016 (ref 1.005–1.030)

## 2018-06-23 LAB — I-STAT TROPONIN, ED: Troponin i, poc: 0 ng/mL (ref 0.00–0.08)

## 2018-06-23 LAB — LIPASE, BLOOD: Lipase: 25 U/L (ref 11–51)

## 2018-06-23 MED ORDER — SODIUM CHLORIDE 0.9% FLUSH: 9.0000 mL | INTRAVENOUS | Status: DC | PRN

## 2018-06-23 MED ORDER — ZOLPIDEM TARTRATE 5 MG PO TABS: 5.0000 mg | ORAL_TABLET | Freq: Every evening | ORAL | Status: DC | PRN

## 2018-06-23 MED ORDER — NALOXONE HCL 0.4 MG/ML IJ SOLN: 0.4000 mg | INTRAMUSCULAR | Status: DC | PRN

## 2018-06-23 MED ORDER — HYOSCYAMINE SULFATE 0.125 MG SL SUBL
0.1250 mg | SUBLINGUAL_TABLET | SUBLINGUAL | Status: DC | PRN
  Filled 2018-06-23: qty 1

## 2018-06-23 MED ORDER — KETOROLAC TROMETHAMINE 30 MG/ML IJ SOLN
30.0000 mg | Freq: Once | INTRAMUSCULAR | Status: AC
Start: 2018-06-23 — End: 2018-06-23
  Administered 2018-06-23: 30 mg via INTRAVENOUS
  Filled 2018-06-23: qty 1

## 2018-06-23 MED ORDER — FLUCONAZOLE IN SODIUM CHLORIDE 200-0.9 MG/100ML-% IV SOLN
200.0000 mg | INTRAVENOUS | Status: DC
  Administered 2018-06-24 (×2): 200 mg via INTRAVENOUS
  Filled 2018-06-23 (×2): qty 100

## 2018-06-23 MED ORDER — BELLADONNA ALKALOIDS-OPIUM 16.2-60 MG RE SUPP: 1.0000 | Freq: Four times a day (QID) | RECTAL | Status: DC | PRN

## 2018-06-23 MED ORDER — HYDROMORPHONE HCL 1 MG/ML IJ SOLN
1.0000 mg | Freq: Once | INTRAMUSCULAR | Status: AC
  Administered 2018-06-23: 1 mg via INTRAVENOUS
  Filled 2018-06-23: qty 1

## 2018-06-23 MED ORDER — ONDANSETRON HCL 4 MG/2ML IJ SOLN: 4.0000 mg | INTRAMUSCULAR | Status: DC | PRN

## 2018-06-23 MED ORDER — DIPHENHYDRAMINE HCL 12.5 MG/5ML PO ELIX: 12.5000 mg | ORAL_SOLUTION | Freq: Four times a day (QID) | ORAL | Status: DC | PRN

## 2018-06-23 MED ORDER — SODIUM CHLORIDE 0.9 % IV BOLUS
1000.0000 mL | Freq: Once | INTRAVENOUS | Status: AC
  Administered 2018-06-23: 1000 mL via INTRAVENOUS

## 2018-06-23 MED ORDER — DIPHENHYDRAMINE HCL 50 MG/ML IJ SOLN: 12.5000 mg | Freq: Four times a day (QID) | INTRAMUSCULAR | Status: DC | PRN

## 2018-06-23 MED ORDER — KETOROLAC TROMETHAMINE 15 MG/ML IJ SOLN
15.0000 mg | Freq: Four times a day (QID) | INTRAMUSCULAR | Status: DC
  Administered 2018-06-24 – 2018-06-25 (×6): 15 mg via INTRAVENOUS
  Filled 2018-06-23 (×6): qty 1

## 2018-06-23 MED ORDER — ACETAMINOPHEN 325 MG PO TABS
650.0000 mg | ORAL_TABLET | ORAL | Status: DC | PRN
  Administered 2018-06-23 – 2018-06-24 (×2): 650 mg via ORAL
  Filled 2018-06-23 (×2): qty 2

## 2018-06-23 MED ORDER — SENNA 8.6 MG PO TABS
1.0000 | ORAL_TABLET | Freq: Two times a day (BID) | ORAL | Status: DC
  Administered 2018-06-23 – 2018-06-25 (×4): 8.6 mg via ORAL
  Filled 2018-06-23 (×4): qty 1

## 2018-06-23 MED ORDER — IOPAMIDOL (ISOVUE-300) INJECTION 61%
100.0000 mL | Freq: Once | INTRAVENOUS | Status: AC | PRN
  Administered 2018-06-23: 100 mL via INTRAVENOUS

## 2018-06-23 MED ORDER — SODIUM CHLORIDE 0.9 % IV SOLN
1.0000 g | INTRAVENOUS | Status: DC
  Administered 2018-06-24: 1 g via INTRAVENOUS
  Filled 2018-06-23: qty 1

## 2018-06-23 MED ORDER — LACTATED RINGERS IV SOLN
INTRAVENOUS | Status: DC
  Administered 2018-06-23 – 2018-06-25 (×3): via INTRAVENOUS

## 2018-06-23 MED ORDER — KETAMINE HCL 50 MG/5ML IJ SOSY: 0.3000 mg/kg | PREFILLED_SYRINGE | Freq: Once | INTRAMUSCULAR | Status: DC

## 2018-06-23 MED ORDER — IOPAMIDOL (ISOVUE-300) INJECTION 61%
INTRAVENOUS | Status: AC
  Filled 2018-06-23: qty 100

## 2018-06-23 MED ORDER — HYDROMORPHONE 1 MG/ML IV SOLN
INTRAVENOUS | Status: DC
  Administered 2018-06-24: 2.4 mL via INTRAVENOUS
  Administered 2018-06-24: 30 mg via INTRAVENOUS
  Administered 2018-06-24: 2.2 mL via INTRAVENOUS
  Administered 2018-06-24: 1.6 mg via INTRAVENOUS
  Administered 2018-06-24: 2 mg via INTRAVENOUS
  Administered 2018-06-24: 7.2 mg via INTRAVENOUS
  Administered 2018-06-25: 2 mg via INTRAVENOUS
  Administered 2018-06-25: 1.4 mg via INTRAVENOUS
  Filled 2018-06-23: qty 30

## 2018-06-23 MED ORDER — SODIUM CHLORIDE 0.9 % IV SOLN
1.0000 g | Freq: Once | INTRAVENOUS | Status: AC
  Administered 2018-06-23: 1 g via INTRAVENOUS
  Filled 2018-06-23: qty 10

## 2018-06-23 MED ORDER — ONDANSETRON HCL 4 MG/2ML IJ SOLN
4.0000 mg | Freq: Once | INTRAMUSCULAR | Status: AC
  Administered 2018-06-23: 4 mg via INTRAVENOUS
  Filled 2018-06-23: qty 2

## 2018-06-23 MED ORDER — TAMSULOSIN HCL 0.4 MG PO CAPS
0.4000 mg | ORAL_CAPSULE | Freq: Every day | ORAL | Status: DC
  Administered 2018-06-24 – 2018-06-25 (×2): 0.4 mg via ORAL
  Filled 2018-06-23 (×2): qty 1

## 2018-06-23 NOTE — ED Provider Notes (Signed)
Medical screening examination/treatment/procedure(s) were conducted as a shared visit with non-physician practitioner(s) and myself.  I personally evaluated the patient during the encounter.  None Patient is status post percutaneous nephrostomy and double-J stent placement.  Pain is getting much worse since yesterday.  She is taking Percocet but still having severe pain.  Patient is alert and nontoxic.  She is expressing severe pain.  Heart is regular lungs clear.  Abdomen soft with diffuse right-sided pain to palpation..  Flank incision site clean and dry without swelling or hematoma.  She has fever in the emergency department, tachycardia and pain.  Patient has required significant amount of pain control in the emergency department.  She has been hydrated and given Rocephin.  PA-C Nils Flack has consulted Dr. Alinda Money, agree with plan of management.   Charlesetta Shanks, MD 06/26/18 1233

## 2018-06-23 NOTE — ED Triage Notes (Signed)
Patient here from home with complaints of right side flank pain. Pain 10/10. Oxycodone without relief. Reports surgery for stone on Monday.

## 2018-06-23 NOTE — ED Provider Notes (Signed)
[facility] DEPT Provider Note   CSN: [id number] Arrival date & time: 06/23/18  1453     History   Chief Complaint Chief Complaint  Patient presents with  . Flank Pain    HPI Haley Charles is a 47 y.o. female with history of anxiety, diverticulosis, GERD, nephrolithiasis, PVD presents for evaluation of progressively worsening right flank pain.  She is 2 days status post right percutaneous nephrostomy sheath placement with Dr. Karsten Ro with right percutaneous nephrolithotomy of a 2.1 cm UPJ stone and antegrade double-J stent placement.  She was discharged yesterday and states that her pain has progressively worsened and has been uncontrolled.  Pain is sharp and stabbing, radiates from the right flank to the right lower abdomen.  She finds mild relief with Percocet.  She does note dysuria and urinary frequency but denies hematuria, difficulty urinating, or urgency.  Endorses nausea but no vomiting.  Denies chest pain or shortness of breath.  States that she has felt flushed and subjectively feverish at home.  The history is provided by the patient.    Past Medical History:  Diagnosis Date  . Anxiety   . Colon polyp   . Diverticulosis   . Enteritis    History of in colonoscopy 2006  . GERD (gastroesophageal reflux disease)   . History of kidney stones   . Peripheral vascular disease (HCC)    right leg  . PONV (postoperative nausea and vomiting)     Patient Active Problem List   Diagnosis Date Noted  . Pyelonephritis 06/23/2018  . Renal calculus, right 06/21/2018  . Acute flank pain 06/18/2018  . Flank pain 06/18/2018    Past Surgical History:  Procedure Laterality Date  . APPENDECTOMY    . COLONOSCOPY  04/16/2018  . IR URETERAL STENT RIGHT NEW ACCESS W/O SEP NEPHROSTOMY CATH  06/18/2018  . NEPHROLITHOTOMY Right 06/21/2018   Procedure: RIGHT NEPHROLITHOTOMY PERCUTANEOUS;  Surgeon: Kathie Rhodes, MD;  Location: [facility];  Service: Urology;   Laterality: Right;  . TUBAL LIGATION  21 years ago  . UPPER GI ENDOSCOPY       OB History   None      Home Medications    Prior to Admission medications   Medication Sig Start Date End Date Taking? Authorizing Provider  Oxycodone HCl 20 MG TABS Take 1 tablet by mouth every 4 (four) hours as needed (pain).   Yes [provider]  oxyCODONE-acetaminophen (PERCOCET) 5-325 MG tablet Take 1-2 tablets by mouth every 6 (six) hours as needed for moderate pain or severe pain. 06/19/18 06/19/19 Yes Alexis Frock, MD  senna-docusate (SENOKOT-S) 8.6-50 MG tablet Take 1 tablet by mouth 2 (two) times daily. While taking pain meds to prevent constipation 06/19/18  Yes Alexis Frock, MD  tolterodine (DETROL LA) 4 MG 24 hr capsule Take 1 capsule (4 mg total) by mouth daily. 06/22/18  Yes Kathie Rhodes, MD  Oxycodone HCl 10 MG TABS Take 1 tablet (10 mg total) by mouth every 4 (four) hours as needed. Patient not taking: Reported on 06/23/2018 06/22/18   Kathie Rhodes, MD    Family History Family History  Problem Relation Age of Onset  . Colon polyps Mother   . Esophagitis Mother   . Esophageal cancer Mother   . CAD Father   . Hypertension Father   . Diabetes Mellitus II Father   . Lung cancer Father   . Rectal cancer Neg Hx   . Stomach cancer Neg Hx   .  Colon cancer Neg Hx     Social History Social History   Tobacco Use  . Smoking status: Former Smoker    Packs/day: 1.00    Years: 14.00    Pack years: 14.00    Types: Cigarettes    Last attempt to quit: 2009    Years since quitting: 10.5  . Smokeless tobacco: Never Used  Substance Use Topics  . Alcohol use: Yes    Comment: occassional  . Drug use: Not Currently     Allergies   Latex   Review of Systems Review of Systems  Constitutional: Positive for chills and fever.  Respiratory: Negative for shortness of breath.   Cardiovascular: Negative for chest pain.  Gastrointestinal: Positive for abdominal pain and nausea.  Negative for vomiting.  Genitourinary: Positive for dysuria, flank pain and urgency. Negative for decreased urine volume and hematuria.  All other systems reviewed and are negative.    Physical Exam Updated Vital Signs BP 131/61 (BP Location: Right Arm)   Pulse 98   Temp 98.7 F (37.1 C) (Oral)   Resp 15   SpO2 95%   Physical Exam  Constitutional: She appears well-developed and well-nourished. No distress.  Appears extremely uncomfortable, moving around frequently in bed.  Flushed in appearance.  HENT:  Head: Normocephalic and atraumatic.  Eyes: Conjunctivae are normal. Right eye exhibits no discharge. Left eye exhibits no discharge.  Neck: Normal range of motion. Neck supple. No JVD present. No tracheal deviation present.  Cardiovascular: Regular rhythm and normal heart sounds.  Tachycardic  Pulmonary/Chest: Effort normal and breath sounds normal. No stridor. No respiratory distress. She has no wheezes. She has no rales.  Abdominal: Soft. She exhibits distension. There is tenderness in the right upper quadrant, right lower quadrant, epigastric area, periumbilical area and suprapubic area. There is guarding and CVA tenderness. There is no rebound and negative Murphy's sign.  Decreased bowel sounds, maximally tender to palpation in the right upper quadrant right lower quadrant.  She has right CVA tenderness.  Musculoskeletal: She exhibits no edema.  Neurological: She is alert.  Skin: Skin is warm and dry. No erythema.  Psychiatric: She has a normal mood and affect. Her behavior is normal.  Nursing note and vitals reviewed.    ED Treatments / Results  Labs (all labs ordered are listed, but only abnormal results are displayed) Labs Reviewed  URINALYSIS, ROUTINE W REFLEX MICROSCOPIC - Abnormal; Notable for the following components:      Result Value   APPearance CLOUDY (*)    Glucose, UA 150 (*)    Hgb urine dipstick LARGE (*)    Protein, ur 100 (*)    Leukocytes, UA MODERATE  (*)    RBC / HPF >50 (*)    WBC, UA >50 (*)    Bacteria, UA FEW (*)    All other components within normal limits  CBC WITH DIFFERENTIAL/PLATELET - Abnormal; Notable for the following components:   WBC 15.9 (*)    Neutro Abs 12.8 (*)    Monocytes Absolute 1.3 (*)    All other components within normal limits  COMPREHENSIVE METABOLIC PANEL - Abnormal; Notable for the following components:   Glucose, Bld 214 (*)    Creatinine, Ser 1.07 (*)    All other components within normal limits  I-STAT CG4 LACTIC ACID, ED - Abnormal; Notable for the following components:   Lactic Acid, Venous 2.01 (*)    All other components within normal limits  I-STAT CG4 LACTIC ACID, ED -  Abnormal; Notable for the following components:   Lactic Acid, Venous 2.01 (*)    All other components within normal limits  CULTURE, BLOOD (ROUTINE X 2)  CULTURE, BLOOD (ROUTINE X 2)  URINE CULTURE  LIPASE, BLOOD  I-STAT BETA HCG BLOOD, ED ([facility], [facility], [facility] ONLY)  I-STAT TROPONIN, ED  I-STAT CG4 LACTIC ACID, ED    EKG None  Radiology Ct Abdomen Pelvis W Contrast  Result Date: 06/23/2018 CLINICAL DATA:  Right-sided abdominal pain. Right renal calculi removed on Monday. Right nephrostomy tube placed on Friday. EXAM: CT ABDOMEN AND PELVIS WITH CONTRAST TECHNIQUE: Multidetector CT imaging of the abdomen and pelvis was performed using the standard protocol following bolus administration of intravenous contrast. CONTRAST:  138mL ISOVUE-300 IOPAMIDOL (ISOVUE-300) INJECTION 61% COMPARISON:  04/27/2018 FINDINGS: Lower chest: Scattered atelectasis in both lower lobes, in the lingula, and in the right middle lobe. Upper normal heart size. Hepatobiliary: Unremarkable Pancreas: Unremarkable Spleen: Unremarkable Adrenals/Urinary Tract: Adrenal glands normal. Right perirenal stranding with filling defects in the right renal collecting system probably reflecting blood products. A right double-J ureteral stent is present proximal loop in the  collecting system, and distal loop in the urinary bladder. Focal hypodensity along the inferior pole of the right kidney is likely inflammatory and probably relates to recent nephrostomy. Trace amount of gas tracking along the right posterior perirenal fascia and along the nephrostomy tract, likely incidental and without a characteristic appearance for abscess. No findings of preferential excretion of contrast along the nephrostomy tract. Duplicated left renal collecting system. Stomach/Bowel: Descending and sigmoid colon diverticulosis. Vascular/Lymphatic: Aortoiliac atherosclerotic vascular disease. Small retroperitoneal lymph nodes are not pathologically enlarged by size criteria. Reproductive: Unremarkable Other: Small amount of fluid in the right paracolic gutter. Musculoskeletal: Unremarkable IMPRESSION: 1. Filling defects taking of much of the right renal collecting system probably reflecting blood products. The proximal loop of the double-J ureteral stent is coiled within this region. 2. Hypodensity in the right kidney lower pole compatible with focal inflammation along the site of nephrostomy. This is not an unexpected appearance. 3. Duplicated left renal collecting system. 4. Scattered atelectasis in the lung bases. 5. Descending and sigmoid colon diverticulosis. 6.  Aortic Atherosclerosis (ICD10-I70.0). 7. Small amount of fluid in the right paracolic gutter, likely related to the adjacent inflammation in the perirenal space. Electronically Signed   By: Van Clines M.D.   On: 06/23/2018 20:04    Procedures Procedures (including critical care time)  Medications Ordered in ED Medications  iopamidol (ISOVUE-300) 61 % injection (has no administration in time range)  cefTRIAXone (ROCEPHIN) 1 g in sodium chloride 0.9 % 100 mL IVPB (has no administration in time range)  HYDROmorphone (DILAUDID) injection 1 mg (1 mg Intravenous Given 06/23/18 1736)  ketorolac (TORADOL) 30 MG/ML injection 30 mg (30  mg Intravenous Given 06/23/18 1735)  ondansetron (ZOFRAN) injection 4 mg (4 mg Intravenous Given 06/23/18 1734)  sodium chloride 0.9 % bolus 1,000 mL (0 mLs Intravenous Stopped 06/23/18 2115)  iopamidol (ISOVUE-300) 61 % injection 100 mL (100 mLs Intravenous Contrast Given 06/23/18 1935)  HYDROmorphone (DILAUDID) injection 1 mg (1 mg Intravenous Given 06/23/18 2025)     Initial Impression / Assessment and Plan / ED Course  I have reviewed the triage vital signs and the nursing notes.  Pertinent labs & imaging results that were available during my care of the patient were reviewed by me and considered in my medical decision making (see chart for details).     Patient 2 days status post  percutaneous nephrostomy sheath placement and right percutaneous nephro lithotomy presenting with ongoing and worsening right sided flank and abdominal pain.  She is febrile to 100.6 degrees, tachycardic and uncomfortable in appearance.  Lab work reviewed by me significant for mildly elevated lactate, leukocytosis of 15.9, mildly elevated creatinine of 1.07.  She is also hyperglycemic.  No significant metabolic derangement.  UA significant for many RBCs, many WBCs, few bacteria, budding yeast, and protein.  We will culture and give empiric Rocephin.  CT of the abdomen and pelvis shows filling defect of much of the right renal collecting system probably reflecting blood products.  The stent appears to be in place.  No definitive evidence of abscess but there is perinephric stranding suggesting pyelonephritis.  Pain control has been difficult while in the ED.  Spoke with Dr. Alinda Money with urology who agrees to assume care of patient and bring her to the hospital for further evaluation and management.  Final Clinical Impressions(s) / ED Diagnoses   Final diagnoses:  Fever in adult  Acute post-operative pain  Pyelonephritis, acute    ED Discharge Orders    None       Debroah Baller 06/23/18 2128    Charlesetta Shanks, MD 06/26/18 1234

## 2018-06-23 NOTE — ED Notes (Signed)
ED TO INPATIENT HANDOFF REPORT  Name/Age/Gender Haley Charles 47 y.o. female  Code Status Code Status History    Date Active Date Inactive Code Status Order ID Comments User Context   06/21/2018 1024 06/22/2018 1342 Full Code [id number]  Kathie Rhodes, MD Inpatient   06/18/2018 1616 06/19/2018 1341 Full Code [id number]  Kathie Rhodes, MD [facility]      Home/SNF/Other Home  Chief Complaint post-op problems  Level of Care/Admitting Diagnosis ED Disposition    ED Disposition Condition [facility] Area: [facility] [[id number]]  Level of Care: Med-Surg [16]  Diagnosis: Pyelonephritis [751700]  Admitting Physician: Raynelle Bring [[id number]]  Attending Physician: Kathie Rhodes [[id number]]  PT Class (Do Not Modify): Observation [104]  PT Acc Code (Do Not Modify): Observation [10022]       Medical History Past Medical History:  Diagnosis Date  . Anxiety   . Colon polyp   . Diverticulosis   . Enteritis    History of in colonoscopy 2006  . GERD (gastroesophageal reflux disease)   . History of kidney stones   . Peripheral vascular disease (HCC)    right leg  . PONV (postoperative nausea and vomiting)     Allergies Allergies  Allergen Reactions  . Latex Rash    IV Location/Drains/Wounds Patient Lines/Drains/Airways Status   Active Line/Drains/Airways    Name:   Placement date:   Placement time:   Site:   Days:   Peripheral IV 06/23/18 Left;Medial;Anterior Forearm   06/23/18    1713    Forearm   less than 1   Nephrostomy Right   06/18/18    1900    Right   5   Ureteral Drain/Stent Right ureter 6 Fr.   06/21/18    0824    Right ureter   2   Incision (Closed) 06/21/18 Back Right   06/21/18    0828     2          Labs/Imaging Results for orders placed or performed during the hospital encounter of 06/23/18 (from the past 48 hour(s))  I-Stat beta hCG blood, ED     Status: None   Collection Time: 06/23/18  3:56 PM  Result Value Ref Range   I-stat hCG,  quantitative <5.0 <5 mIU/mL   Comment 3            Comment:   GEST. AGE      CONC.  (mIU/mL)   <=1 WEEK        5 - 50     2 WEEKS       50 - 500     3 WEEKS       100 - 10,000     4 WEEKS     1,000 - 30,000        FEMALE AND NON-PREGNANT FEMALE:     LESS THAN 5 mIU/mL   CBC with Differential     Status: Abnormal   Collection Time: 06/23/18  5:14 PM  Result Value Ref Range   WBC 15.9 (H) 4.0 - 10.5 K/uL   RBC 4.51 3.87 - 5.11 MIL/uL   Hemoglobin 13.3 12.0 - 15.0 g/dL   HCT 39.1 36.0 - 46.0 %   MCV 86.7 78.0 - 100.0 fL   MCH 29.5 26.0 - 34.0 pg   MCHC 34.0 30.0 - 36.0 g/dL   RDW 12.3 11.5 - 15.5 %   Platelets 290 150 - 400 K/uL   Neutrophils Relative %  81 %   Neutro Abs 12.8 (H) 1.7 - 7.7 K/uL   Lymphocytes Relative 11 %   Lymphs Abs 1.7 0.7 - 4.0 K/uL   Monocytes Relative 8 %   Monocytes Absolute 1.3 (H) 0.1 - 1.0 K/uL   Eosinophils Relative 0 %   Eosinophils Absolute 0.1 0.0 - 0.7 K/uL   Basophils Relative 0 %   Basophils Absolute 0.0 0.0 - 0.1 K/uL    Comment: Performed at [facility], [place] [address], [place], [place] [address]  Comprehensive metabolic panel     Status: Abnormal   Collection Time: 06/23/18  5:14 PM  Result Value Ref Range   Sodium 141 135 - 145 mmol/L   Potassium 3.6 3.5 - 5.1 mmol/L   Chloride 104 98 - 111 mmol/L    Comment: Please note change in reference range.   CO2 24 22 - 32 mmol/L   Glucose, Bld 214 (H) 70 - 99 mg/dL    Comment: Please note change in reference range.   BUN 17 6 - 20 mg/dL    Comment: Please note change in reference range.   Creatinine, Ser 1.07 (H) 0.44 - 1.00 mg/dL   Calcium 9.2 8.9 - 10.3 mg/dL   Total Protein 7.4 6.5 - 8.1 g/dL   Albumin 3.8 3.5 - 5.0 g/dL   AST 20 15 - 41 U/L   ALT 28 0 - 44 U/L    Comment: Please note change in reference range.   Alkaline Phosphatase 109 38 - 126 U/L   Total Bilirubin 0.4 0.3 - 1.2 mg/dL   GFR calc non Af Amer >60 >60 mL/min   GFR calc Af Amer >60 >60 mL/min     Comment: (NOTE) The eGFR has been calculated using the CKD EPI equation. This calculation has not been validated in all clinical situations. eGFR's persistently <60 mL/min signify possible Chronic Kidney Disease.    Anion gap 13 5 - 15    Comment: Performed at [facility], [place] [address], [place], [place] [address]  Lipase, blood     Status: None   Collection Time: 06/23/18  5:14 PM  Result Value Ref Range   Lipase 25 11 - 51 U/L    Comment: Performed at [facility], [place] [address], [place], [place] [address]  I-stat troponin, ED     Status: None   Collection Time: 06/23/18  5:21 PM  Result Value Ref Range   Troponin i, poc 0.00 0.00 - 0.08 ng/mL   Comment 3            Comment: Due to the release kinetics of cTnI, a negative result within the first hours of the onset of symptoms does not rule out myocardial infarction with certainty. If myocardial infarction is still suspected, repeat the test at appropriate intervals.   I-Stat CG4 Lactic Acid, ED     Status: Abnormal   Collection Time: 06/23/18  5:23 PM  Result Value Ref Range   Lactic Acid, Venous 2.01 (HH) 0.5 - 1.9 mmol/L   Comment NOTIFIED PHYSICIAN   I-Stat CG4 Lactic Acid, ED     Status: Abnormal   Collection Time: 06/23/18  5:23 PM  Result Value Ref Range   Lactic Acid, Venous 2.01 (HH) 0.5 - 1.9 mmol/L   Comment NOTIFIED PHYSICIAN   Urinalysis, Routine w reflex microscopic- may I&O cath if menses     Status: Abnormal   Collection Time: 06/23/18  7:33 PM  Result Value  Ref Range   Color, Urine YELLOW YELLOW   APPearance CLOUDY (A) CLEAR   Specific Gravity, Urine 1.016 1.005 - 1.030   pH 6.0 5.0 - 8.0   Glucose, UA 150 (A) NEGATIVE mg/dL   Hgb urine dipstick LARGE (A) NEGATIVE   Bilirubin Urine NEGATIVE NEGATIVE   Ketones, ur NEGATIVE NEGATIVE mg/dL   Protein, ur 100 (A) NEGATIVE mg/dL   Nitrite NEGATIVE NEGATIVE   Leukocytes, UA MODERATE (A) NEGATIVE   RBC / HPF >50 (H) 0  - 5 RBC/hpf   WBC, UA >50 (H) 0 - 5 WBC/hpf   Bacteria, UA FEW (A) NONE SEEN   Squamous Epithelial / LPF 6-10 0 - 5   Mucus PRESENT    Budding Yeast PRESENT     Comment: Performed at [facility], [place] [address], [place], [place] [address]   Ct Abdomen Pelvis W Contrast  Result Date: 06/23/2018 CLINICAL DATA:  Right-sided abdominal pain. Right renal calculi removed on Monday. Right nephrostomy tube placed on Friday. EXAM: CT ABDOMEN AND PELVIS WITH CONTRAST TECHNIQUE: Multidetector CT imaging of the abdomen and pelvis was performed using the standard protocol following bolus administration of intravenous contrast. CONTRAST:  1106m ISOVUE-300 IOPAMIDOL (ISOVUE-300) INJECTION 61% COMPARISON:  04/27/2018 FINDINGS: Lower chest: Scattered atelectasis in both lower lobes, in the lingula, and in the right middle lobe. Upper normal heart size. Hepatobiliary: Unremarkable Pancreas: Unremarkable Spleen: Unremarkable Adrenals/Urinary Tract: Adrenal glands normal. Right perirenal stranding with filling defects in the right renal collecting system probably reflecting blood products. A right double-J ureteral stent is present proximal loop in the collecting system, and distal loop in the urinary bladder. Focal hypodensity along the inferior pole of the right kidney is likely inflammatory and probably relates to recent nephrostomy. Trace amount of gas tracking along the right posterior perirenal fascia and along the nephrostomy tract, likely incidental and without a characteristic appearance for abscess. No findings of preferential excretion of contrast along the nephrostomy tract. Duplicated left renal collecting system. Stomach/Bowel: Descending and sigmoid colon diverticulosis. Vascular/Lymphatic: Aortoiliac atherosclerotic vascular disease. Small retroperitoneal lymph nodes are not pathologically enlarged by size criteria. Reproductive: Unremarkable Other: Small amount of fluid in the right  paracolic gutter. Musculoskeletal: Unremarkable IMPRESSION: 1. Filling defects taking of much of the right renal collecting system probably reflecting blood products. The proximal loop of the double-J ureteral stent is coiled within this region. 2. Hypodensity in the right kidney lower pole compatible with focal inflammation along the site of nephrostomy. This is not an unexpected appearance. 3. Duplicated left renal collecting system. 4. Scattered atelectasis in the lung bases. 5. Descending and sigmoid colon diverticulosis. 6.  Aortic Atherosclerosis (ICD10-I70.0). 7. Small amount of fluid in the right paracolic gutter, likely related to the adjacent inflammation in the perirenal space. Electronically Signed   By: WVan ClinesM.D.   On: 06/23/2018 20:04    Pending Labs Unresulted Labs (From admission, onward)   Start     Ordered   06/23/18 1725  Blood culture (routine x 2)  BLOOD CULTURE X 2,   STAT     06/23/18 1724   06/23/18 1725  Urine culture  STAT,   STAT     06/23/18 1724   Signed and Held  Lactic acid, plasma  Tomorrow morning,   R     Signed and Held   Signed and Held  CBC  Tomorrow morning,   R     Signed and Held      Vitals/Pain  Today's Vitals   06/23/18 1800 06/23/18 1801 06/23/18 1959 06/23/18 2051  BP: (!) 119/58  131/61   Pulse: 97  98   Resp: (!) 22  15   Temp:   98.7 F (37.1 C)   TempSrc:   Oral   SpO2: 92%  95%   PainSc:  6  7  4      Isolation Precautions No active isolations  Medications Medications  iopamidol (ISOVUE-300) 61 % injection (has no administration in time range)  cefTRIAXone (ROCEPHIN) 1 g in sodium chloride 0.9 % 100 mL IVPB (has no administration in time range)  HYDROmorphone (DILAUDID) injection 1 mg (1 mg Intravenous Given 06/23/18 1736)  ketorolac (TORADOL) 30 MG/ML injection 30 mg (30 mg Intravenous Given 06/23/18 1735)  ondansetron (ZOFRAN) injection 4 mg (4 mg Intravenous Given 06/23/18 1734)  sodium chloride 0.9 % bolus 1,000 mL  (0 mLs Intravenous Stopped 06/23/18 2115)  iopamidol (ISOVUE-300) 61 % injection 100 mL (100 mLs Intravenous Contrast Given 06/23/18 1935)  HYDROmorphone (DILAUDID) injection 1 mg (1 mg Intravenous Given 06/23/18 2025)    Mobility walks

## 2018-06-23 NOTE — H&P (Signed)
H&P  Chief Complaint: Right flank pain, fever  History of Present Illness: Haley Charles is a 47 y.o. year old who underwent a right ureteral stenting by Dr. Karsten Ro last Friday and right PCNL this Monday for a 2.1 cm renal calculus.  She was left with an indwelling right ureteral stent.  She developed worsening pain today that she describes as 10/10 and not controlled with Percocet.  Her pain radiates from her right flank across her RLQ.  She also has had fever to 100.6.  Lactic acid is mildly elevated at 2.01.  She has been tachycardic but this has resolved with IVF hydration and pain control.  Past Medical History:  Diagnosis Date  . Anxiety   . Colon polyp   . Diverticulosis   . Enteritis    History of in colonoscopy 2006  . GERD (gastroesophageal reflux disease)   . History of kidney stones   . Peripheral vascular disease (HCC)    right leg  . PONV (postoperative nausea and vomiting)     Past Surgical History:  Procedure Laterality Date  . APPENDECTOMY    . COLONOSCOPY  04/16/2018  . IR URETERAL STENT RIGHT NEW ACCESS W/O SEP NEPHROSTOMY CATH  06/18/2018  . NEPHROLITHOTOMY Right 06/21/2018   Procedure: RIGHT NEPHROLITHOTOMY PERCUTANEOUS;  Surgeon: Kathie Rhodes, MD;  Location: [facility];  Service: Urology;  Laterality: Right;  . TUBAL LIGATION  21 years ago  . UPPER GI ENDOSCOPY      Home Medications:  Current Meds  Medication Sig  . Oxycodone HCl 20 MG TABS Take 1 tablet by mouth every 4 (four) hours as needed (pain).  Marland Kitchen oxyCODONE-acetaminophen (PERCOCET) 5-325 MG tablet Take 1-2 tablets by mouth every 6 (six) hours as needed for moderate pain or severe pain.  Marland Kitchen senna-docusate (SENOKOT-S) 8.6-50 MG tablet Take 1 tablet by mouth 2 (two) times daily. While taking pain meds to prevent constipation  . tolterodine (DETROL LA) 4 MG 24 hr capsule Take 1 capsule (4 mg total) by mouth daily.    Allergies:  Allergies  Allergen Reactions  . Latex Rash    Family History  Problem  Relation Age of Onset  . Colon polyps Mother   . Esophagitis Mother   . Esophageal cancer Mother   . CAD Father   . Hypertension Father   . Diabetes Mellitus II Father   . Lung cancer Father   . Rectal cancer Neg Hx   . Stomach cancer Neg Hx   . Colon cancer Neg Hx     Social History:  reports that she quit smoking about 10 years ago. Her smoking use included cigarettes. She has a 14.00 pack-year smoking history. She has never used smokeless tobacco. She reports that she drinks alcohol. She reports that she has current or past drug history.  ROS: A complete review of systems was performed.  All systems are negative except for pertinent findings as noted.  Physical Exam:  Vital signs in last 24 hours: Temp:  [98.7 F (37.1 C)-100.6 F (38.1 C)] 98.7 F (37.1 C) (07/17 1959) Pulse Rate:  [97-122] 98 (07/17 1959) Resp:  [15-24] 15 (07/17 1959) BP: (119-177)/(58-91) 131/61 (07/17 1959) SpO2:  [92 %-95 %] 95 % (07/17 1959) Constitutional:  Alert and oriented, No acute distress Cardiovascular: Regular rate and rhythm, No JVD Respiratory: Normal respiratory effort GI: Abdomen is soft, nondistended, no abdominal masses, moderate right abdominal tenderness GU: Moderate right CVAT Lymphatic: No lymphadenopathy Neurologic: Grossly intact, no focal deficits Psychiatric: Normal  mood and affect  Laboratory Data:  Recent Labs    06/23/18 1714  WBC 15.9*  HGB 13.3  HCT 39.1  PLT 290    Recent Labs    06/23/18 1714  NA 141  K 3.6  CL 104  GLUCOSE 214*  BUN 17  CALCIUM 9.2  CREATININE 1.07*     Results for orders placed or performed during the hospital encounter of 06/23/18 (from the past 24 hour(s))  I-Stat beta hCG blood, ED     Status: None   Collection Time: 06/23/18  3:56 PM  Result Value Ref Range   I-stat hCG, quantitative <5.0 <5 mIU/mL   Comment 3          CBC with Differential     Status: Abnormal   Collection Time: 06/23/18  5:14 PM  Result Value Ref Range    WBC 15.9 (H) 4.0 - 10.5 K/uL   RBC 4.51 3.87 - 5.11 MIL/uL   Hemoglobin 13.3 12.0 - 15.0 g/dL   HCT 39.1 36.0 - 46.0 %   MCV 86.7 78.0 - 100.0 fL   MCH 29.5 26.0 - 34.0 pg   MCHC 34.0 30.0 - 36.0 g/dL   RDW 12.3 11.5 - 15.5 %   Platelets 290 150 - 400 K/uL   Neutrophils Relative % 81 %   Neutro Abs 12.8 (H) 1.7 - 7.7 K/uL   Lymphocytes Relative 11 %   Lymphs Abs 1.7 0.7 - 4.0 K/uL   Monocytes Relative 8 %   Monocytes Absolute 1.3 (H) 0.1 - 1.0 K/uL   Eosinophils Relative 0 %   Eosinophils Absolute 0.1 0.0 - 0.7 K/uL   Basophils Relative 0 %   Basophils Absolute 0.0 0.0 - 0.1 K/uL  Comprehensive metabolic panel     Status: Abnormal   Collection Time: 06/23/18  5:14 PM  Result Value Ref Range   Sodium 141 135 - 145 mmol/L   Potassium 3.6 3.5 - 5.1 mmol/L   Chloride 104 98 - 111 mmol/L   CO2 24 22 - 32 mmol/L   Glucose, Bld 214 (H) 70 - 99 mg/dL   BUN 17 6 - 20 mg/dL   Creatinine, Ser 1.07 (H) 0.44 - 1.00 mg/dL   Calcium 9.2 8.9 - 10.3 mg/dL   Total Protein 7.4 6.5 - 8.1 g/dL   Albumin 3.8 3.5 - 5.0 g/dL   AST 20 15 - 41 U/L   ALT 28 0 - 44 U/L   Alkaline Phosphatase 109 38 - 126 U/L   Total Bilirubin 0.4 0.3 - 1.2 mg/dL   GFR calc non Af Amer >60 >60 mL/min   GFR calc Af Amer >60 >60 mL/min   Anion gap 13 5 - 15  Lipase, blood     Status: None   Collection Time: 06/23/18  5:14 PM  Result Value Ref Range   Lipase 25 11 - 51 U/L  I-stat troponin, ED     Status: None   Collection Time: 06/23/18  5:21 PM  Result Value Ref Range   Troponin i, poc 0.00 0.00 - 0.08 ng/mL   Comment 3          I-Stat CG4 Lactic Acid, ED     Status: Abnormal   Collection Time: 06/23/18  5:23 PM  Result Value Ref Range   Lactic Acid, Venous 2.01 (HH) 0.5 - 1.9 mmol/L   Comment NOTIFIED PHYSICIAN   I-Stat CG4 Lactic Acid, ED     Status: Abnormal   Collection Time:  06/23/18  5:23 PM  Result Value Ref Range   Lactic Acid, Venous 2.01 (HH) 0.5 - 1.9 mmol/L   Comment NOTIFIED PHYSICIAN    Urinalysis, Routine w reflex microscopic- may I&O cath if menses     Status: Abnormal   Collection Time: 06/23/18  7:33 PM  Result Value Ref Range   Color, Urine YELLOW YELLOW   APPearance CLOUDY (A) CLEAR   Specific Gravity, Urine 1.016 1.005 - 1.030   pH 6.0 5.0 - 8.0   Glucose, UA 150 (A) NEGATIVE mg/dL   Hgb urine dipstick LARGE (A) NEGATIVE   Bilirubin Urine NEGATIVE NEGATIVE   Ketones, ur NEGATIVE NEGATIVE mg/dL   Protein, ur 100 (A) NEGATIVE mg/dL   Nitrite NEGATIVE NEGATIVE   Leukocytes, UA MODERATE (A) NEGATIVE   RBC / HPF >50 (H) 0 - 5 RBC/hpf   WBC, UA >50 (H) 0 - 5 WBC/hpf   Bacteria, UA FEW (A) NONE SEEN   Squamous Epithelial / LPF 6-10 0 - 5   Mucus PRESENT    Budding Yeast PRESENT    No results found for this or any previous visit (from the past 240 hour(s)).  Renal Function: Recent Labs    06/18/18 1026 06/19/18 0445 06/23/18 1714  CREATININE 0.58 0.73 1.07*   Estimated Creatinine Clearance: 68.9 mL/min (A) (by C-G formula based on SCr of 1.07 mg/dL (H)).  Radiologic Imaging: Ct Abdomen Pelvis W Contrast  Result Date: 06/23/2018 CLINICAL DATA:  Right-sided abdominal pain. Right renal calculi removed on Monday. Right nephrostomy tube placed on Friday. EXAM: CT ABDOMEN AND PELVIS WITH CONTRAST TECHNIQUE: Multidetector CT imaging of the abdomen and pelvis was performed using the standard protocol following bolus administration of intravenous contrast. CONTRAST:  131mL ISOVUE-300 IOPAMIDOL (ISOVUE-300) INJECTION 61% COMPARISON:  04/27/2018 FINDINGS: Lower chest: Scattered atelectasis in both lower lobes, in the lingula, and in the right middle lobe. Upper normal heart size. Hepatobiliary: Unremarkable Pancreas: Unremarkable Spleen: Unremarkable Adrenals/Urinary Tract: Adrenal glands normal. Right perirenal stranding with filling defects in the right renal collecting system probably reflecting blood products. A right double-J ureteral stent is present proximal loop in  the collecting system, and distal loop in the urinary bladder. Focal hypodensity along the inferior pole of the right kidney is likely inflammatory and probably relates to recent nephrostomy. Trace amount of gas tracking along the right posterior perirenal fascia and along the nephrostomy tract, likely incidental and without a characteristic appearance for abscess. No findings of preferential excretion of contrast along the nephrostomy tract. Duplicated left renal collecting system. Stomach/Bowel: Descending and sigmoid colon diverticulosis. Vascular/Lymphatic: Aortoiliac atherosclerotic vascular disease. Small retroperitoneal lymph nodes are not pathologically enlarged by size criteria. Reproductive: Unremarkable Other: Small amount of fluid in the right paracolic gutter. Musculoskeletal: Unremarkable IMPRESSION: 1. Filling defects taking of much of the right renal collecting system probably reflecting blood products. The proximal loop of the double-J ureteral stent is coiled within this region. 2. Hypodensity in the right kidney lower pole compatible with focal inflammation along the site of nephrostomy. This is not an unexpected appearance. 3. Duplicated left renal collecting system. 4. Scattered atelectasis in the lung bases. 5. Descending and sigmoid colon diverticulosis. 6.  Aortic Atherosclerosis (ICD10-I70.0). 7. Small amount of fluid in the right paracolic gutter, likely related to the adjacent inflammation in the perirenal space. Electronically Signed   By: Van Clines M.D.   On: 06/23/2018 20:04    Impression/Assessment:  Right abdominal/flank pain and fever  Plan:  Admit to the hospital  for IV antibiotics and antifungal therapy.  Urine culture sent.  Her pain likely is ureteral stent pain.  In light of possible infection, she needs to keep stent indwelling right now.  Will proceed with pain control with IV pain medication, alpha blockers, anticholinergic therapy, etc.    Francisco Ostrovsky,LES 06/23/2018, 8:49 PM  Pryor Curia. MD

## 2018-06-23 NOTE — ED Notes (Signed)
Called floor nurse in room will call ed rn back for report

## 2018-06-23 NOTE — ED Notes (Signed)
Pt reports having R kidney stones removed Monday by Dr. Karsten Ro.  Had R nephrostomy tubed placed Friday last week.  She reports pain was tolerable when she was discharged Monday and has worsened.  She reports RLQ pain that radiates to her R flank with nausea but no vomiting.  Family is at bedside.  She reports pain is aggravated with movement and sitting up.  She can tolerated turning to the side.  Her abdomen is very tender.

## 2018-06-24 ENCOUNTER — Other Ambulatory Visit: Payer: Self-pay

## 2018-06-24 ENCOUNTER — Encounter (HOSPITAL_COMMUNITY): Payer: Self-pay

## 2018-06-24 LAB — CBC
HCT: 34 % — ABNORMAL LOW (ref 36.0–46.0)
Hemoglobin: 11.5 g/dL — ABNORMAL LOW (ref 12.0–15.0)
MCH: 29.4 pg (ref 26.0–34.0)
MCHC: 33.8 g/dL (ref 30.0–36.0)
MCV: 87 fL (ref 78.0–100.0)
Platelets: 247 10*3/uL (ref 150–400)
RBC: 3.91 MIL/uL (ref 3.87–5.11)
RDW: 12.6 % (ref 11.5–15.5)
WBC: 12.4 10*3/uL — AB (ref 4.0–10.5)

## 2018-06-24 LAB — LACTIC ACID, PLASMA: Lactic Acid, Venous: 0.8 mmol/L (ref 0.5–1.9)

## 2018-06-24 NOTE — Progress Notes (Signed)
Patient ID: Haley Charles, female   DOB: February 24, 1971, 47 y.o.   MRN: [id number]    Assessment: Left flank pain status post left PCNL: Her stone was not struvite, she received intravenous perioperative and postoperative antibiotics and therefore I think the probability of pyelonephritis is low although she did have some very slightly elevated lactic acid level when she came in.  At this point she remains afebrile.  Her white count is up slightly at 12.4 but has decreased from admission and she remains on intravenous antifungal and antibiotics.  A urine culture and blood culture remain pending.  Her CT scan revealed no significant perinephric fluid collection.  It appears all of her stone has been removed.  Her stent is in good position and there is some decreased perfusion of the lower pole consistent with inflammation from her PCNL with access through the lower pole.   Plan:  1.  Continue IV anti-infective agents. 2.  Her stent will need to remain indwelling. 3.  Await blood and urine culture results.  Subjective: Haley Charles reports she has soreness in the left flank but otherwise is in no distress.  No nausea or anorexia.  Objective: Vital signs in last 24 hours: Temp:  [97.9 F (36.6 C)-98.7 F (37.1 C)] 98.6 F (37 C) (07/18 0609) Pulse Rate:  [76-122] 84 (07/18 0609) Resp:  [15-24] 17 (07/18 0609) BP: (107-177)/(58-91) 111/63 (07/18 0609) SpO2:  [92 %-100 %] 99 % (07/18 0609)  Intake/Output from previous day: 07/17 0701 - 07/18 0700 In: 1545.8 [P.O.:480; I.V.:965.8; IV Piggyback:100] Out: 0  Intake/Output this shift: No intake/output data recorded.  Past Medical History:  Diagnosis Date  . Anxiety   . Colon polyp   . Diverticulosis   . Enteritis    History of in colonoscopy 2006  . GERD (gastroesophageal reflux disease)   . History of kidney stones   . Peripheral vascular disease (HCC)    right leg  . PONV (postoperative nausea and vomiting)    Current Facility-Administered  Medications  Medication Dose Route Frequency Provider Last Rate Last Dose  . acetaminophen (TYLENOL) tablet 650 mg  650 mg Oral Q4H PRN Raynelle Bring, MD   650 mg at 06/23/18 2252  . cefTRIAXone (ROCEPHIN) 1 g in sodium chloride 0.9 % 100 mL IVPB  1 g Intravenous Q24H Raynelle Bring, MD      . diphenhydrAMINE (BENADRYL) injection 12.5 mg  12.5 mg Intravenous Q6H PRN Raynelle Bring, MD       Or  . diphenhydrAMINE (BENADRYL) 12.5 MG/5ML elixir 12.5 mg  12.5 mg Oral Q6H PRN Raynelle Bring, MD      . fluconazole (DIFLUCAN) IVPB 200 mg  200 mg Intravenous Q24H Raynelle Bring, MD   Stopped at 06/24/18 0118  . HYDROmorphone (DILAUDID) 1 mg/mL PCA injection   Intravenous Q4H Raynelle Bring, MD   30 mg at 06/24/18 0002  . hyoscyamine (LEVSIN SL) SL tablet 0.125 mg  0.125 mg Sublingual Q4H PRN Raynelle Bring, MD      . ketorolac (TORADOL) 15 MG/ML injection 15 mg  15 mg Intravenous Q6H Raynelle Bring, MD   15 mg at 06/24/18 6063  . lactated ringers infusion   Intravenous Continuous Raynelle Bring, MD 150 mL/hr at 06/23/18 2259    . naloxone [facility]) injection 0.4 mg  0.4 mg Intravenous PRN Raynelle Bring, MD       And  . sodium chloride flush (NS) 0.9 % injection 9 mL  9 mL Intravenous PRN Raynelle Bring, MD      .  ondansetron (ZOFRAN) injection 4 mg  4 mg Intravenous Q4H PRN Raynelle Bring, MD      . opium-belladonna (B&O SUPPRETTES) 16.2-60 MG suppository 1 suppository  1 suppository Rectal Q6H PRN Raynelle Bring, MD      . senna (SENOKOT) tablet 8.6 mg  1 tablet Oral BID Raynelle Bring, MD   8.6 mg at 06/23/18 2300  . tamsulosin (FLOMAX) capsule 0.4 mg  0.4 mg Oral Daily Raynelle Bring, MD      . zolpidem (AMBIEN) tablet 5 mg  5 mg Oral QHS PRN Raynelle Bring, MD        Physical Exam:  General: Patient is in no apparent distress Lungs: Normal respiratory effort, chest expands symmetrically. GI: The abdomen is soft and nontender without mass.    Lab Results: Recent Labs    06/23/18 1714  06/24/18 0616  WBC 15.9* 12.4*  HGB 13.3 11.5*  HCT 39.1 34.0*   BMET Recent Labs    06/23/18 1714  NA 141  K 3.6  CL 104  CO2 24  GLUCOSE 214*  BUN 17  CREATININE 1.07*  CALCIUM 9.2   No results for input(s): LABPT, INR in the last 72 hours. No results for input(s): LABURIN in the last 72 hours. No results found for this or any previous visit.  Studies/Results: Ct Abdomen Pelvis W Contrast  Result Date: 06/23/2018 CLINICAL DATA:  Right-sided abdominal pain. Right renal calculi removed on Monday. Right nephrostomy tube placed on Friday. EXAM: CT ABDOMEN AND PELVIS WITH CONTRAST TECHNIQUE: Multidetector CT imaging of the abdomen and pelvis was performed using the standard protocol following bolus administration of intravenous contrast. CONTRAST:  141mL ISOVUE-300 IOPAMIDOL (ISOVUE-300) INJECTION 61% COMPARISON:  04/27/2018 FINDINGS: Lower chest: Scattered atelectasis in both lower lobes, in the lingula, and in the right middle lobe. Upper normal heart size. Hepatobiliary: Unremarkable Pancreas: Unremarkable Spleen: Unremarkable Adrenals/Urinary Tract: Adrenal glands normal. Right perirenal stranding with filling defects in the right renal collecting system probably reflecting blood products. A right double-J ureteral stent is present proximal loop in the collecting system, and distal loop in the urinary bladder. Focal hypodensity along the inferior pole of the right kidney is likely inflammatory and probably relates to recent nephrostomy. Trace amount of gas tracking along the right posterior perirenal fascia and along the nephrostomy tract, likely incidental and without a characteristic appearance for abscess. No findings of preferential excretion of contrast along the nephrostomy tract. Duplicated left renal collecting system. Stomach/Bowel: Descending and sigmoid colon diverticulosis. Vascular/Lymphatic: Aortoiliac atherosclerotic vascular disease. Small retroperitoneal lymph nodes are  not pathologically enlarged by size criteria. Reproductive: Unremarkable Other: Small amount of fluid in the right paracolic gutter. Musculoskeletal: Unremarkable IMPRESSION: 1. Filling defects taking of much of the right renal collecting system probably reflecting blood products. The proximal loop of the double-J ureteral stent is coiled within this region. 2. Hypodensity in the right kidney lower pole compatible with focal inflammation along the site of nephrostomy. This is not an unexpected appearance. 3. Duplicated left renal collecting system. 4. Scattered atelectasis in the lung bases. 5. Descending and sigmoid colon diverticulosis. 6.  Aortic Atherosclerosis (ICD10-I70.0). 7. Small amount of fluid in the right paracolic gutter, likely related to the adjacent inflammation in the perirenal space. Electronically Signed   By: Van Clines M.D.   On: 06/23/2018 20:04       Vernelle Wisner C 06/24/2018, 7:04 AM

## 2018-06-25 MED ORDER — CIPROFLOXACIN HCL 500 MG PO TABS: 500.0000 mg | ORAL_TABLET | Freq: Two times a day (BID) | ORAL | 0 refills | Status: DC

## 2018-06-25 NOTE — Discharge Summary (Signed)
Physician Discharge Summary      Patient ID: Haley Charles MRN: [id number] DOB/AGE: [birth year] 46 y.o.  Admit date: 06/23/2018 Discharge date: 06/25/2018  Admission Diagnoses: Pyelonephritis, acute [N10] Fever in adult [R50.9] Acute post-operative pain [G89.18]  Discharge Diagnoses:  Active Problems:   Pyelonephritis   Discharged Condition: good  Hospital Course: Haley Charles is a 47 year old female who underwent a right PCNL on 06/21/2018.  I was able to remove all of her stone burden and due to minimal bleeding was able to perform the procedure without the need for a postoperative percutaneous nephrostomy tube.  The ureteral stent was left indwelling.  The following day she seemed to be doing well and was discharged home only to develop worsening flank pain that she described as 10 out of 10 and not controlled with oral narcotic pain medication.  She also had experienced a low-grade fever of 100.6.  She presented to the emergency room and a lactic acid was performed that was only very slightly elevated.  She was tachycardic but was in pain and this resolved with IV hydration and pain control.  She was admitted to the hospital and blood and urine cultures were obtained because of the possibility of pyelonephritis and a CT scan revealed no perinephric fluid collections, all stone having been removed and some decreased perfusion in the lower pole.  She had previously had a percutaneous procedure through the lower pole and so this was most likely felt to be inflammatory in nature.  She was observed in the hospital and remained afebrile.  She was maintained on intravenous antibiotics and intravenous antifungals and after 48 hours her pain had continued to diminish to the point where she felt ready to go home.  Her blood cultures preliminarily are negative.  Her urine culture remains pending and we discussed this fact.  I told her that I would allow her to be discharged since I felt the probability of  infection was exceedingly low however I am going to maintain her on oral antibiotics and when her final culture results return will make adjustments according to sensitivities as necessary.  She is in agreement with this and I feel she is suitable for discharge at this time.    Discharge Exam: Blood pressure 113/64, pulse 76, temperature 98.6 F (37 C), temperature source Oral, resp. rate 15, height 5\' 2"  (1.575 m), weight 90.9 kg (200 lb 4.8 oz), SpO2 97 %. General: Awake, alert and in no apparent distress. Chest: Normal respiratory effort. Cardiovascular: Regular rate and rhythm. Abdomen: Soft, nontender, nondistended.   Disposition: Discharge disposition: 01-Home or Self Care       Discharge Instructions    Discharge patient   Complete by:  As directed    Discharge disposition:  01-Home or Self Care   Discharge patient date:  06/25/2018     Allergies as of 06/25/2018      Reactions   Latex Rash      Medication List    TAKE these medications   ciprofloxacin 500 MG tablet Commonly known as:  CIPRO Take 1 tablet (500 mg total) by mouth 2 (two) times daily.   Oxycodone HCl 20 MG Tabs Take 1 tablet by mouth every 4 (four) hours as needed (pain).   Oxycodone HCl 10 MG Tabs Take 1 tablet (10 mg total) by mouth every 4 (four) hours as needed.   oxyCODONE-acetaminophen 5-325 MG tablet Commonly known as:  PERCOCET Take 1-2 tablets by mouth every 6 (six) hours as needed for  moderate pain or severe pain.   senna-docusate 8.6-50 MG tablet Commonly known as:  Senokot-S Take 1 tablet by mouth 2 (two) times daily. While taking pain meds to prevent constipation   tolterodine 4 MG 24 hr capsule Commonly known as:  DETROL LA Take 1 capsule (4 mg total) by mouth daily.      Follow-up Information    [facility].   Why:  As priviously scheduled. Contact information: [place] [phone]           Signed: Claybon Jabs 06/25/2018, 7:13 AM

## 2018-06-25 NOTE — Plan of Care (Signed)
Care plan initiated.

## 2018-06-27 LAB — URINE CULTURE: Culture: 80000 — AB

## 2018-06-28 LAB — CULTURE, BLOOD (ROUTINE X 2)
Culture: NO GROWTH
Culture: NO GROWTH
Special Requests: ADEQUATE
Special Requests: ADEQUATE

## 2019-07-28 IMAGING — XA IR URETURAL STENT RIGHT NEW ACCESS W/O SEP NEPHROSTOMY CATH
1 series · 7 of 7 positions shown · non-contrast
Comparison: 04/27/2018

INDICATION: Obstructing right renal pelvis calculus

EXAM:
ULTRASOUND FLUOROSCOPIC RIGHT NEPHROSTOMY ACCESS (NEPHROLITHOTOMY
ACCESS)

[Series 300: ir ureteral stent right new access w/o s · non-contrast · 7 of 7 slices shown]
[im 1/7]
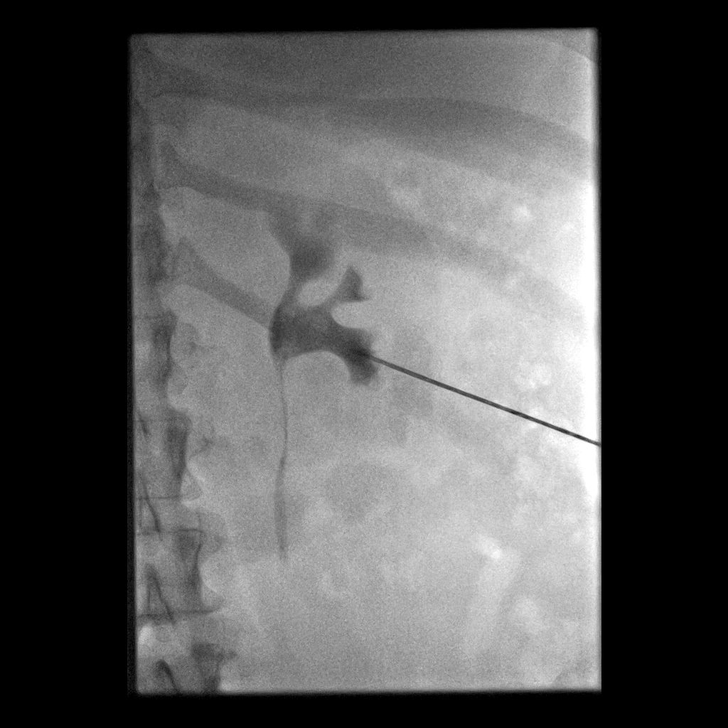
[im 2/7]
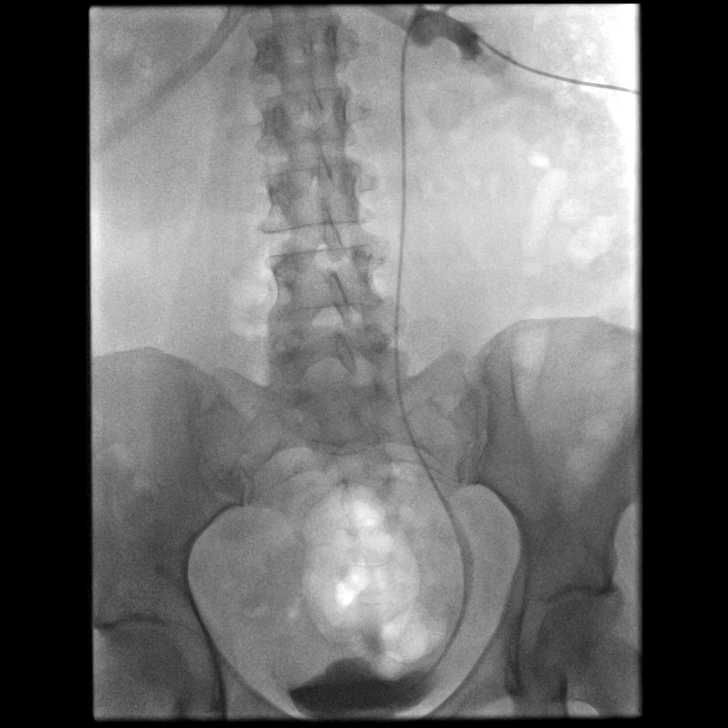
[im 3/7]
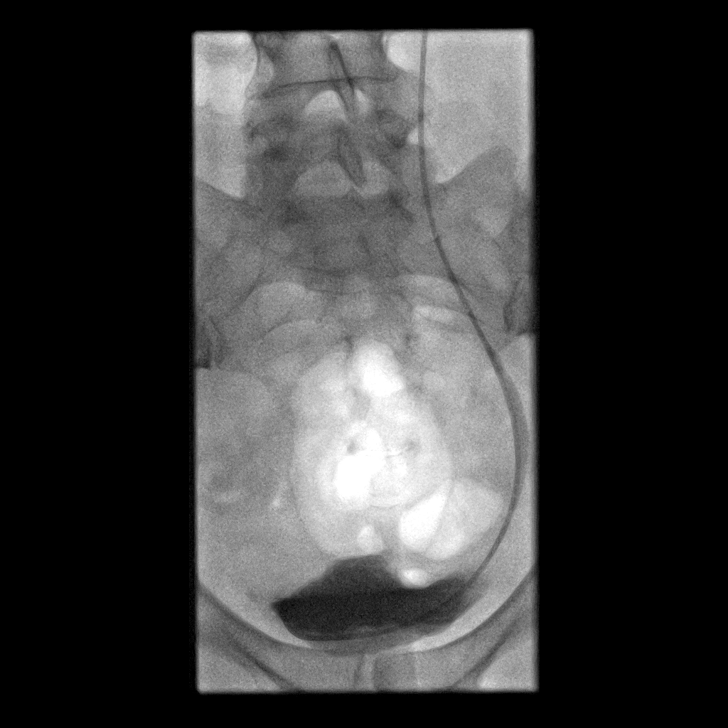
[im 4/7]
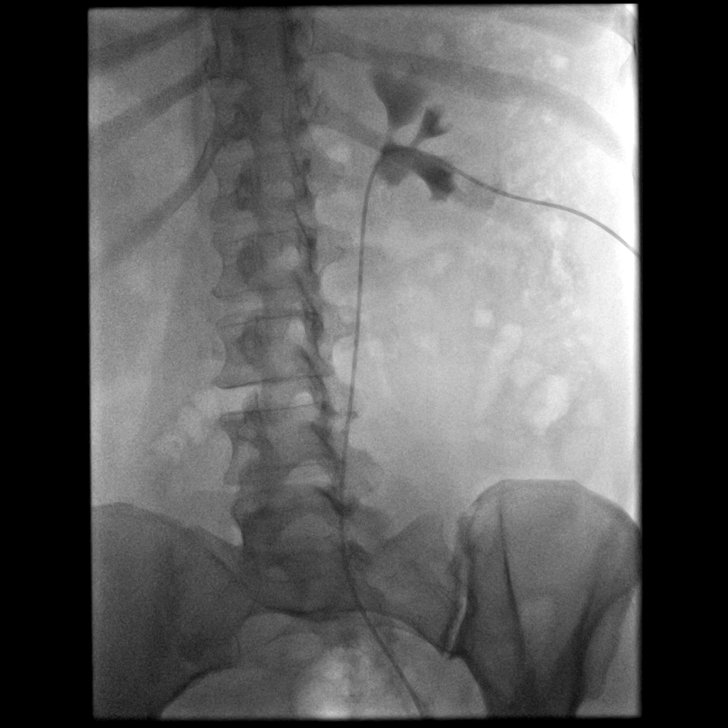
[im 5/7]
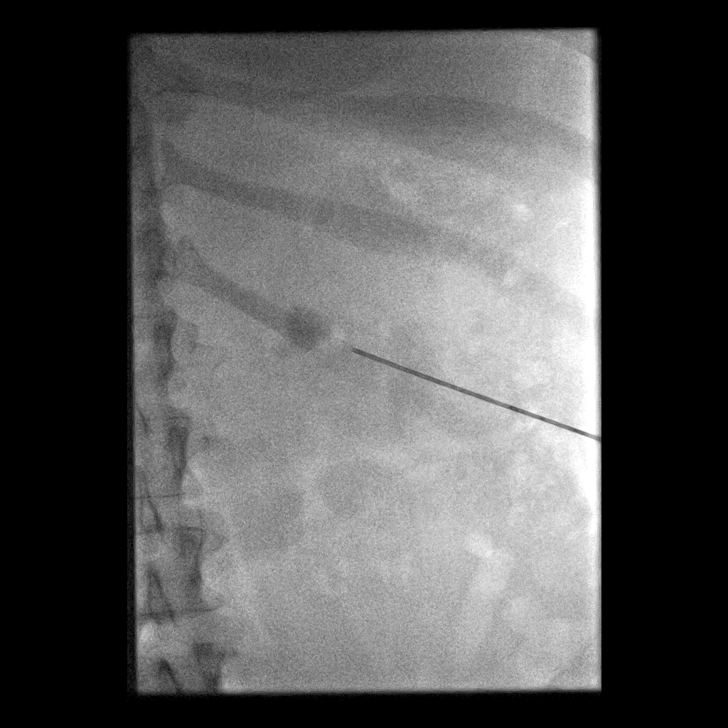
[im 6/7]
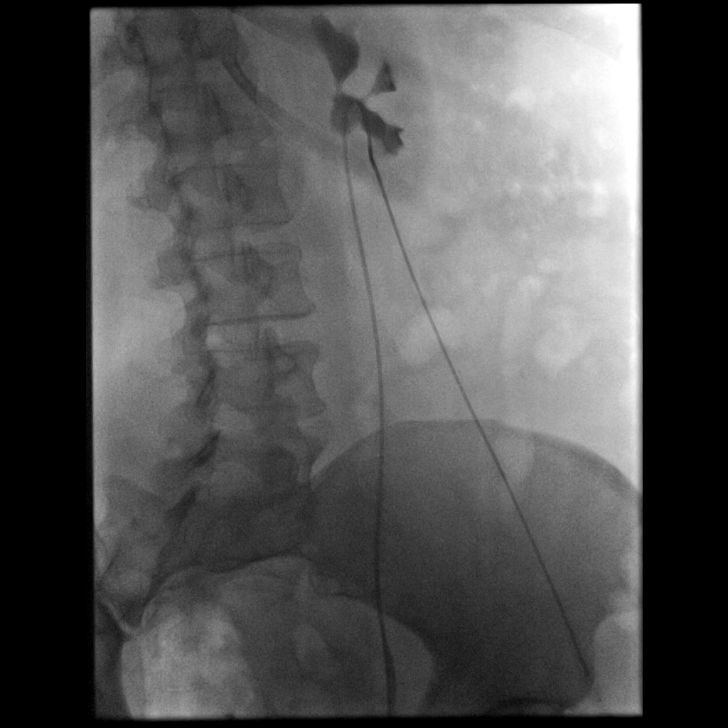
[im 7/7]
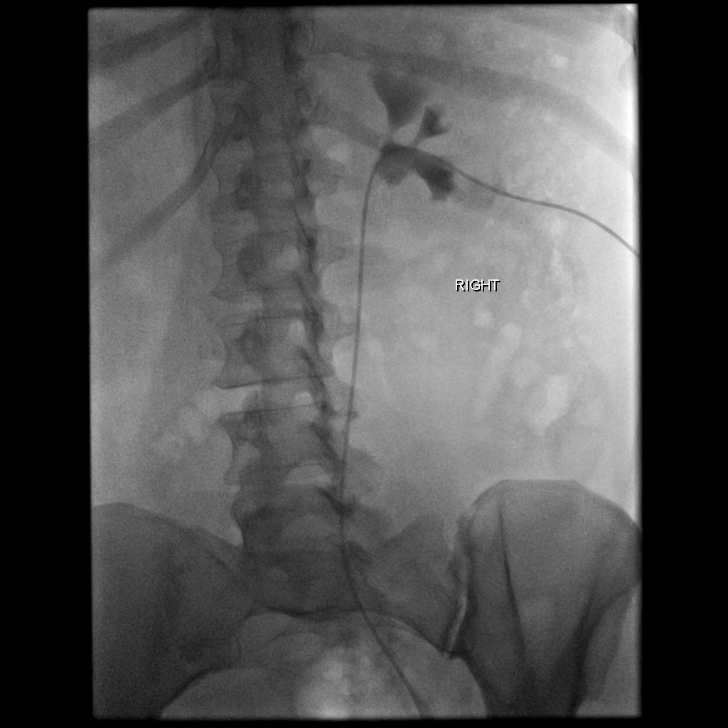

[7 of 7 positions shown; findings below may reference images not displayed]

MEDICATIONS:
ANCEF 2 G; The antibiotic was administered in an appropriate time
frame prior to skin puncture.

ANESTHESIA/SEDATION:
Fentanyl 125 mcg IV; Versed 2.5 mg IV

Moderate Sedation Time:  14 MINUTES

The patient was continuously monitored during the procedure by the
interventional radiology nurse under my direct supervision.

CONTRAST:  10 cc Isovue-M22-administered into the collecting
system(s)

FLUOROSCOPY TIME:  Fluoroscopy Time: 1 minutes 12 seconds (18 mGy).

COMPLICATIONS:
None immediate.

PROCEDURE:
Informed written consent was obtained from the patient after a
thorough discussion of the procedural risks, benefits and
alternatives. All questions were addressed. Maximal Sterile Barrier
Technique was utilized including caps, mask, sterile gowns, sterile
gloves, sterile drape, hand hygiene and skin antiseptic. A timeout
was performed prior to the initiation of the procedure.

Previous imaging reviewed. Patient position prone. Preliminary
ultrasound performed. The right kidney containing the renal pelvis
calculus was localized and marked.

Under sterile conditions and local anesthesia, an 18 gauge 15 cm
access needle was advanced over the twelfth rib under direct
ultrasound into a dilated mid to lower pole calyx. Images obtained
for documentation. Needle position confirmed with ultrasound. There
was return of urine. Contrast injection confirms position within a
posterior calyx. Renal pelvis stone noted. Bentson guidewire
advanced. Kumpe catheter and Bentson guidewire advanced into the
ureter and inferiorly into the bladder. Contrast injection confirms
position in the bladder. Catheter secured with a silk suture.
Nephrostomy access was capped externally followed by sterile
dressing. No immediate complication. Patient tolerated the procedure
well.
IMPRESSION: Successful ultrasound and fluoroscopic right nephrostomy access as
above

## 2019-07-28 IMAGING — US US RENAL
1 series · 14 of 25 positions shown · non-contrast
Comparison: CT scan of April 27, 2018.

CLINICAL DATA: Right flank pain status post right nephrostomy and
ureteral stent placement.

EXAM:
RENAL / URINARY TRACT ULTRASOUND COMPLETE

[Series 1: us renal · 14 of 55 slices shown]
[im 1/55]
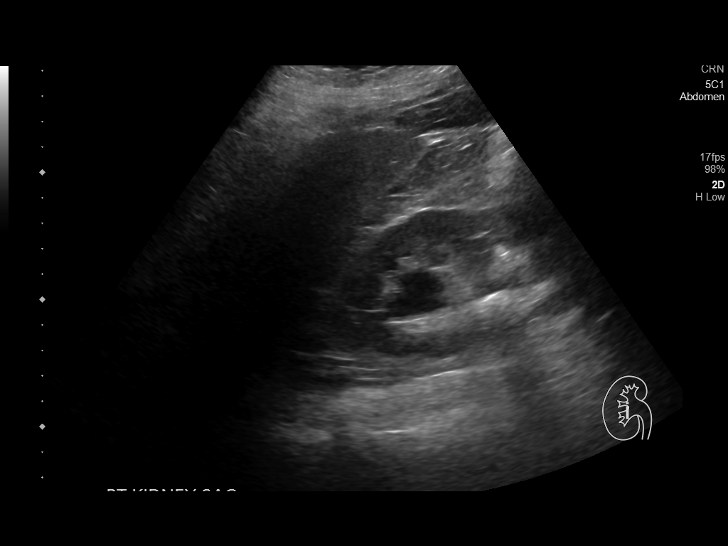
[im 5/55]
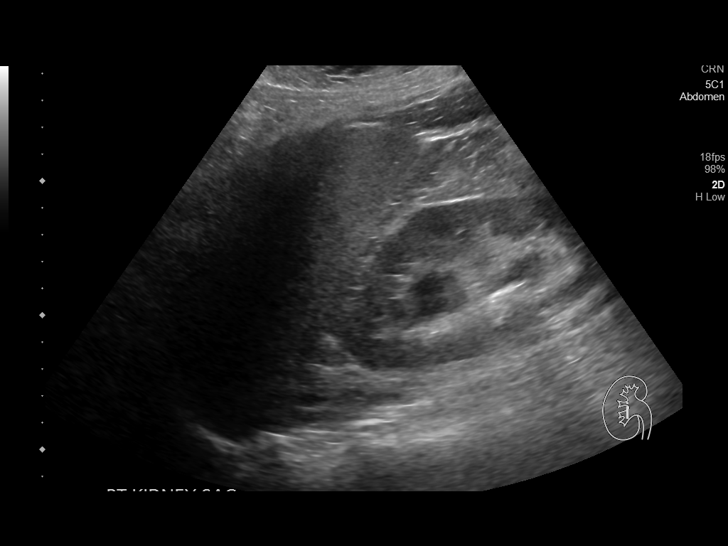
[im 10/55]
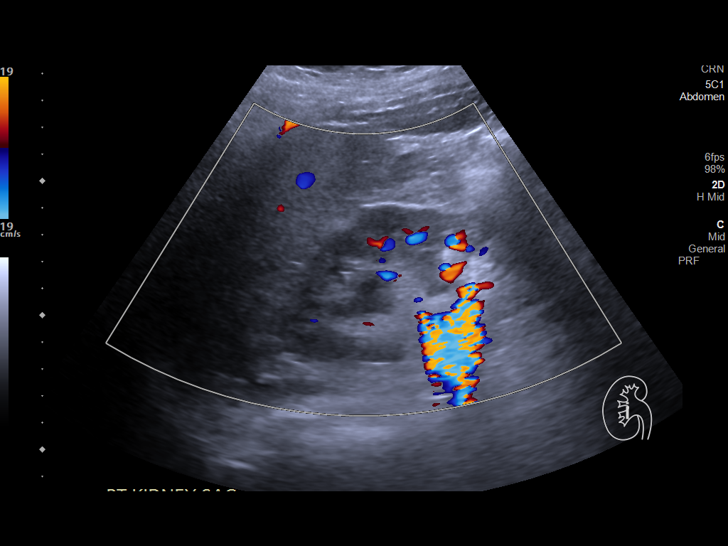
[im 14/55]
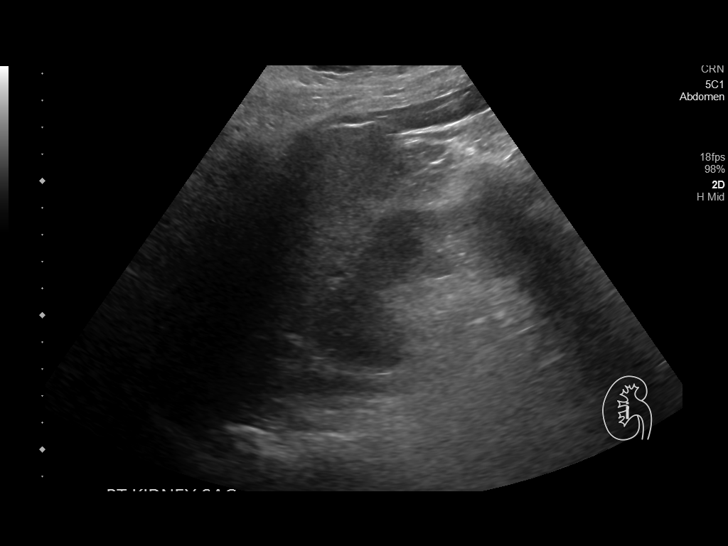
[im 19/55]
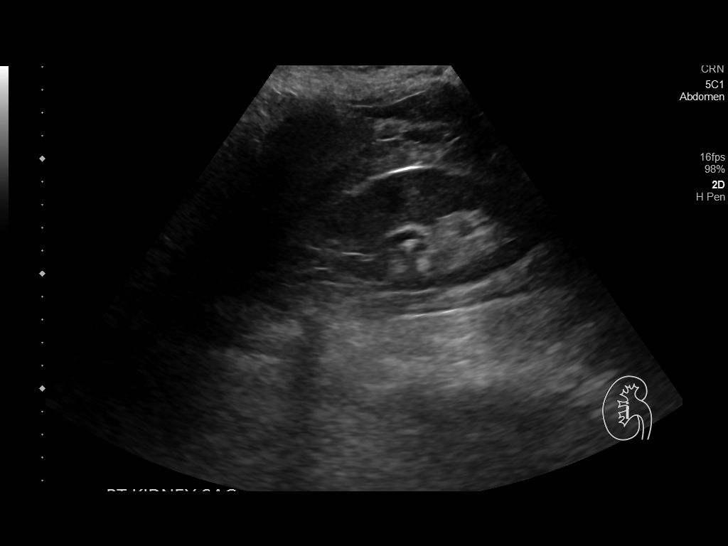
[im 21/55]
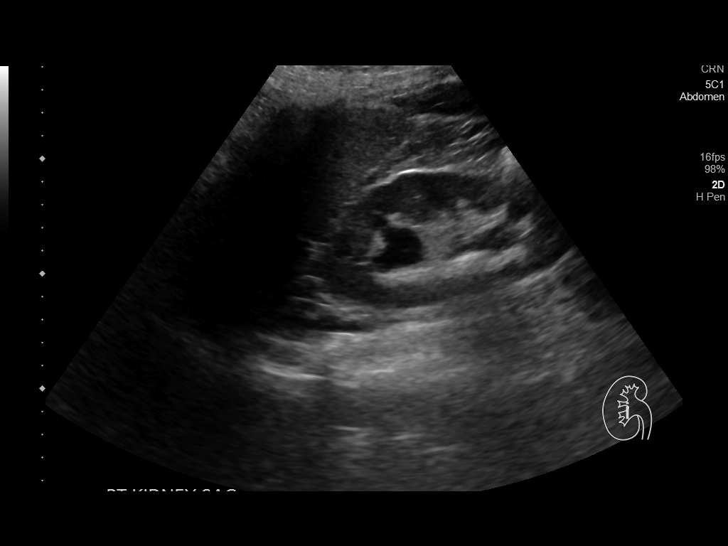
[im 25/55]
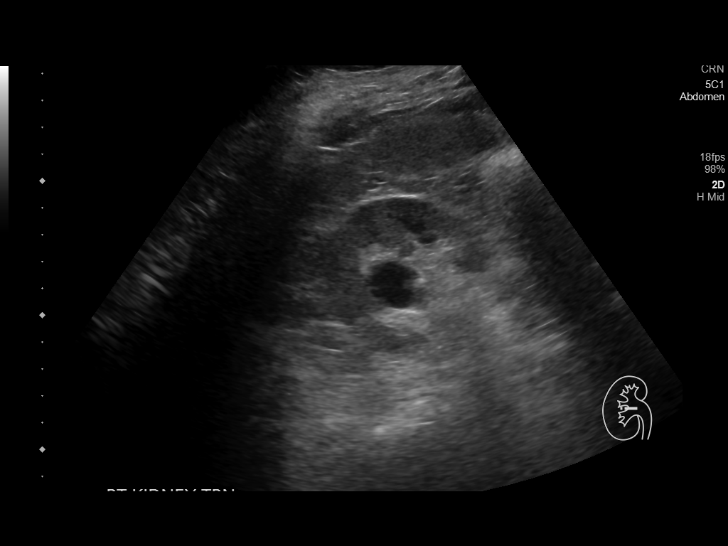
[im 30/55]
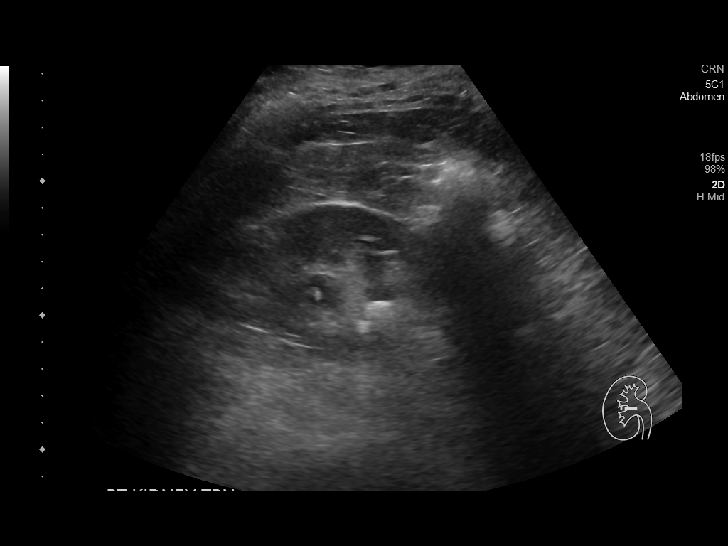
[im 34/55]
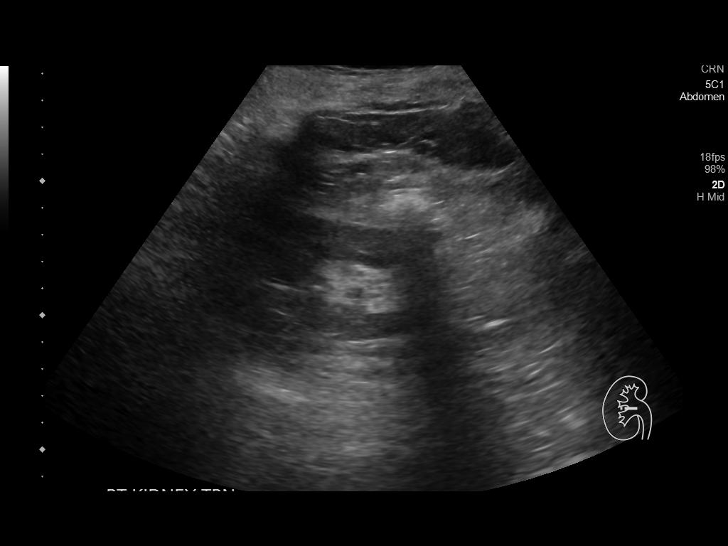
[im 37/55]
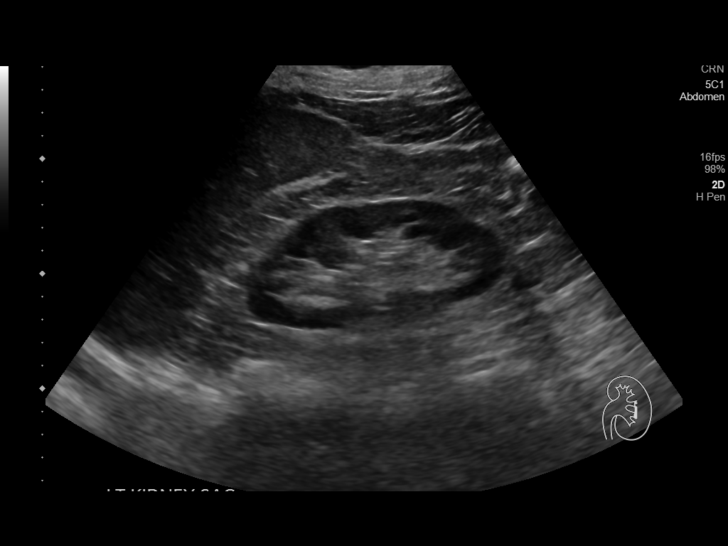
[im 41/55]
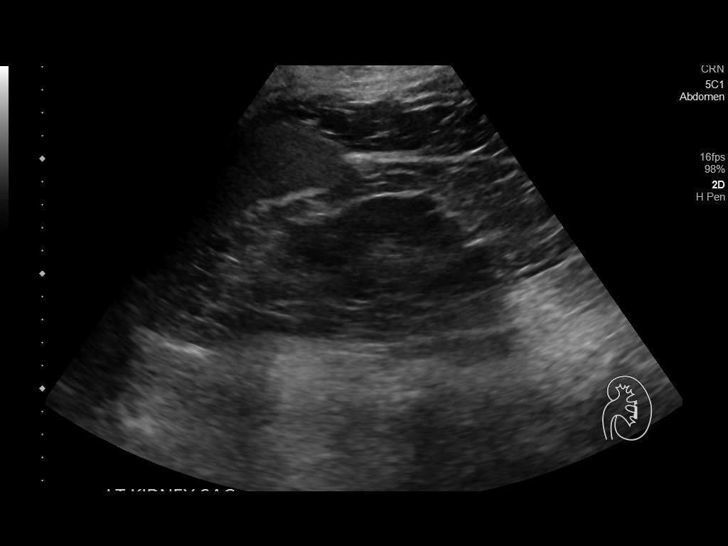
[im 46/55]
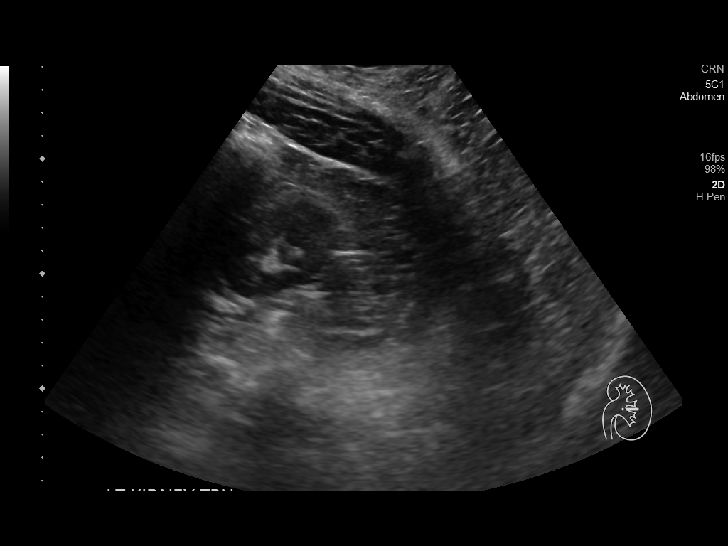
[im 50/55]
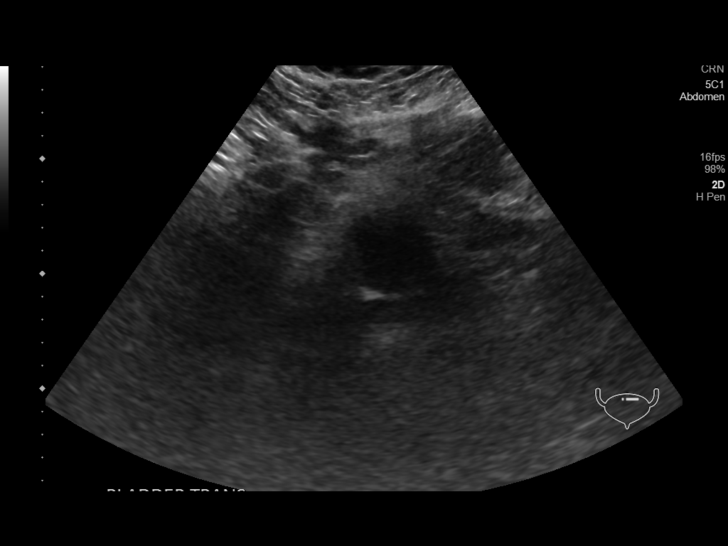
[im 55/55]
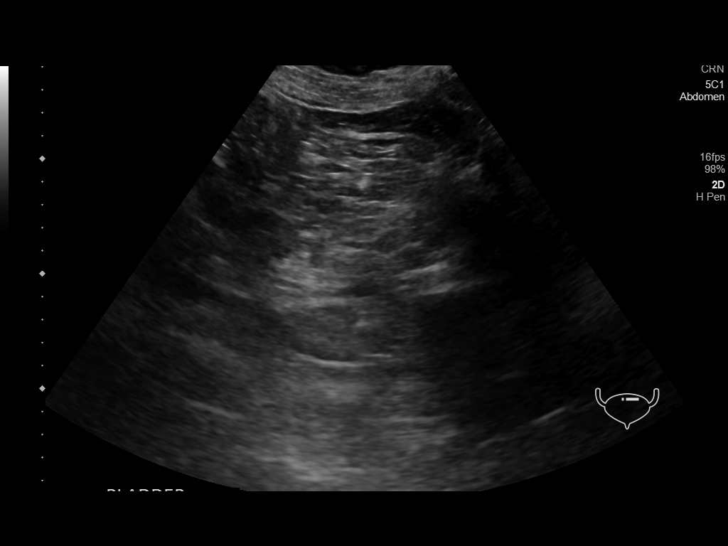

[14 of 25 positions shown; findings below may reference images not displayed]

FINDINGS: Right Kidney:

Length: 11.1 cm. Echogenicity within normal limits. No mass
visualized. Mild right hydronephrosis is noted. 1.3 cm calculus is
noted in lower pole collecting system.

Left Kidney:

Length: 11.6 cm. Echogenicity within normal limits. No mass or
hydronephrosis visualized.

Bladder:

Decompressed due to postvoid status.
IMPRESSION: Mild right hydronephrosis is noted with 1.3 cm calculus seen in
right intrarenal collecting system.

## 2019-08-02 IMAGING — CT CT ABD-PELV W/ CM
2 of 5 series · 16 of 46 positions shown, 18 images · IV contrast (ISOVUE)
Comparison: 04/27/2018

CLINICAL DATA: Right-sided abdominal pain. Right renal calculi
removed on [REDACTED]. Right nephrostomy tube placed on [REDACTED].

EXAM:
CT ABDOMEN AND PELVIS WITH CONTRAST
TECHNIQUE: Multidetector CT imaging of the abdomen and pelvis was performed
using the standard protocol following bolus administration of
intravenous contrast.
CONTRAST:  100mL [id number] IOPAMIDOL ([id number]) INJECTION 61%

[Series 2: axial st · axial · 0.88mm/px · z∈[-770,-340]mm · 13 of 100 slices shown, 15 images]
[im 7/100  soft-tissue]
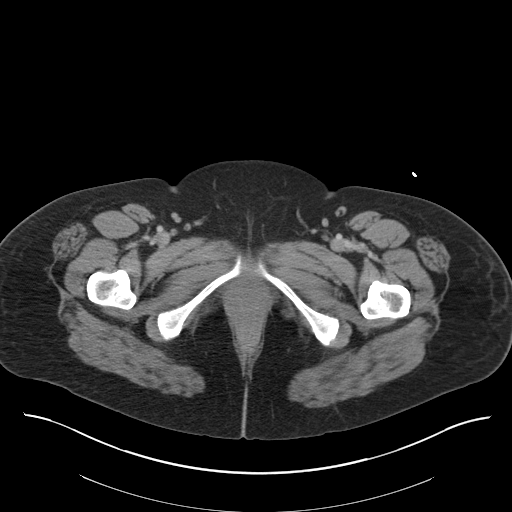
[im 7/100  bone]
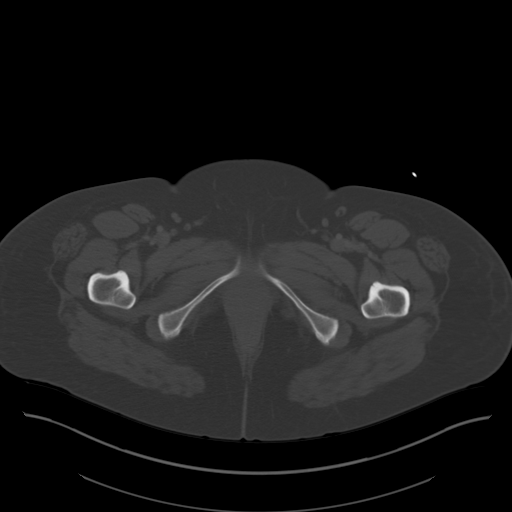
[im 14/100  soft-tissue]
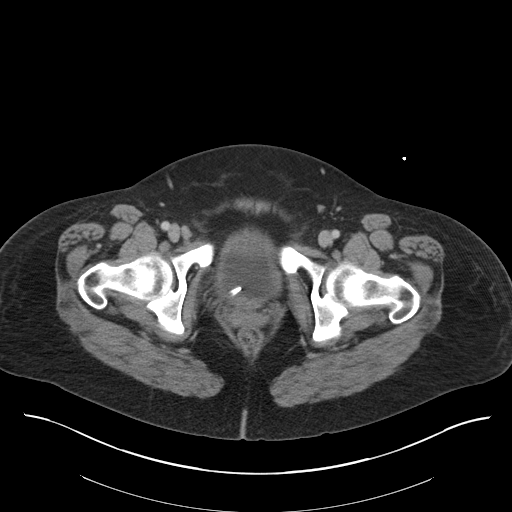
[im 20/100  soft-tissue]
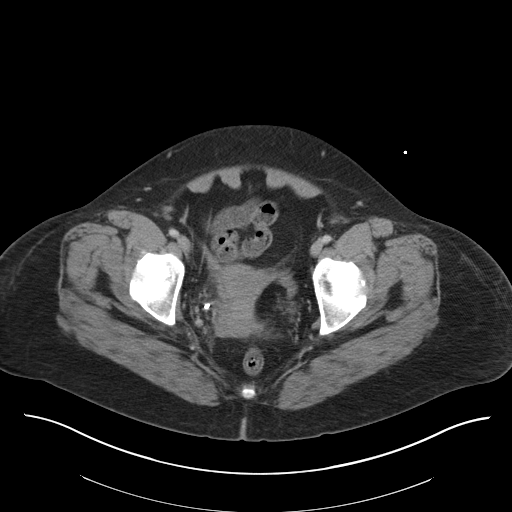
[im 27/100  soft-tissue]
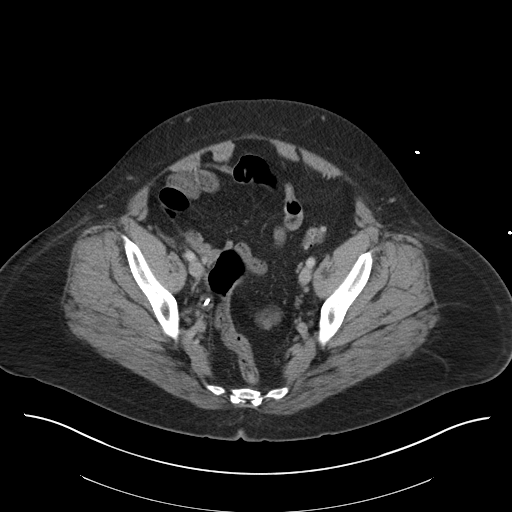
[im 34/100  soft-tissue]
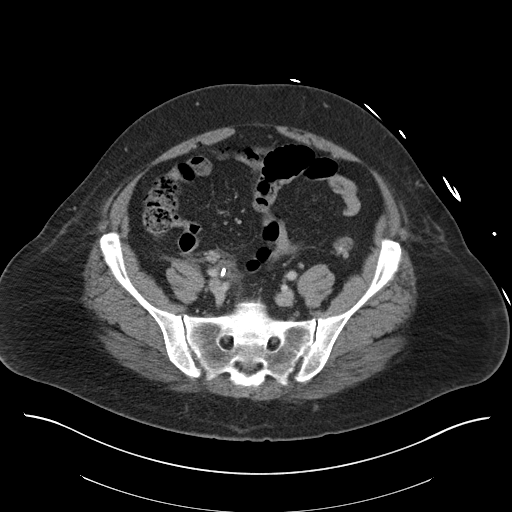
[im 40/100  soft-tissue]
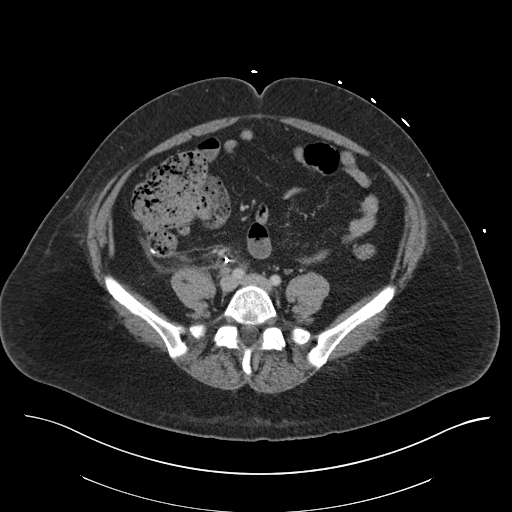
[im 53/100  soft-tissue]
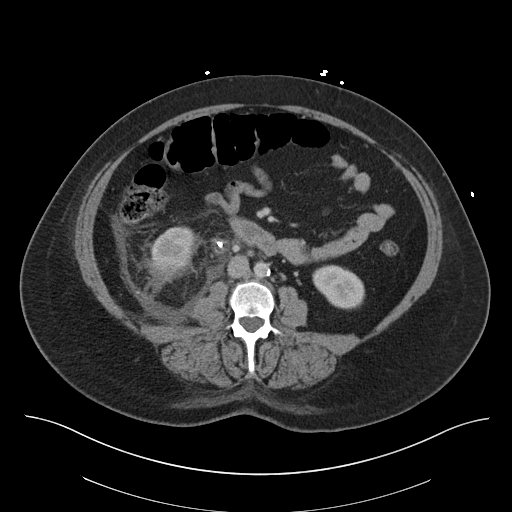
[im 60/100  soft-tissue]
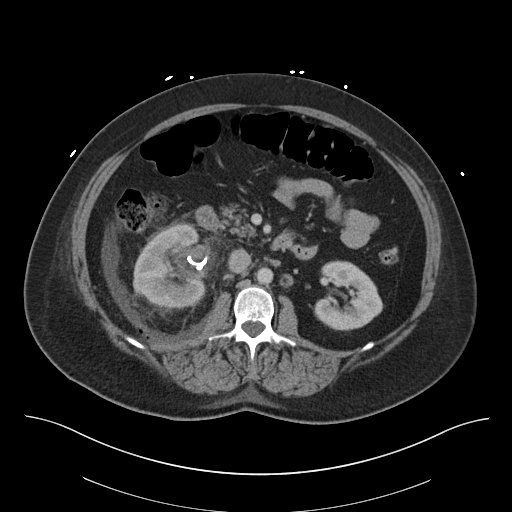
[im 67/100  soft-tissue]
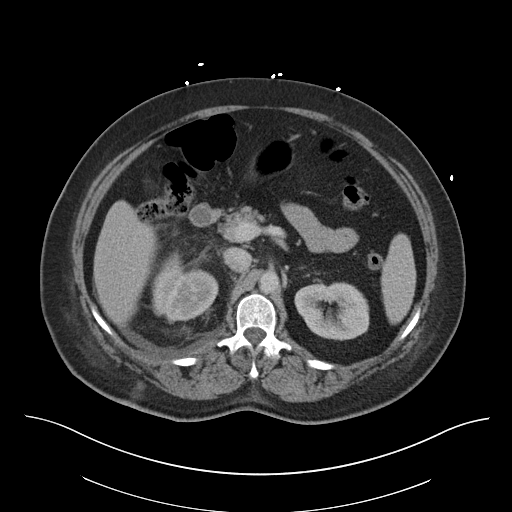
[im 67/100  bone]
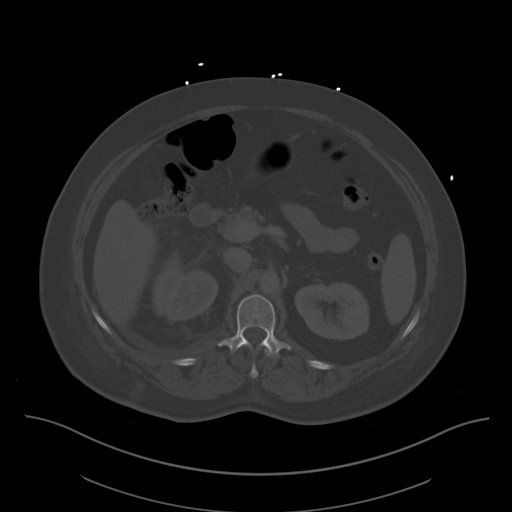
[im 73/100  soft-tissue]
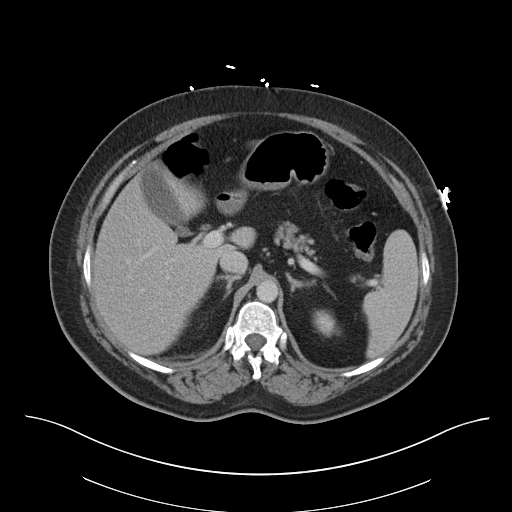
[im 80/100  soft-tissue]
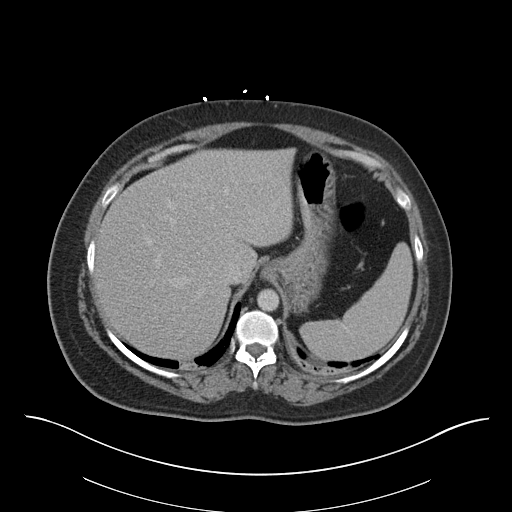
[im 86/100  soft-tissue]
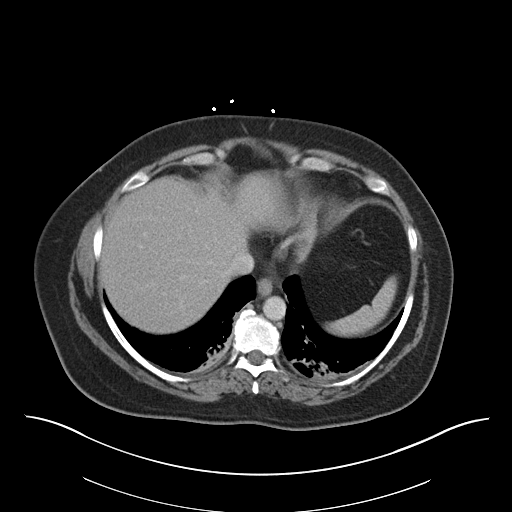
[im 93/100  soft-tissue]
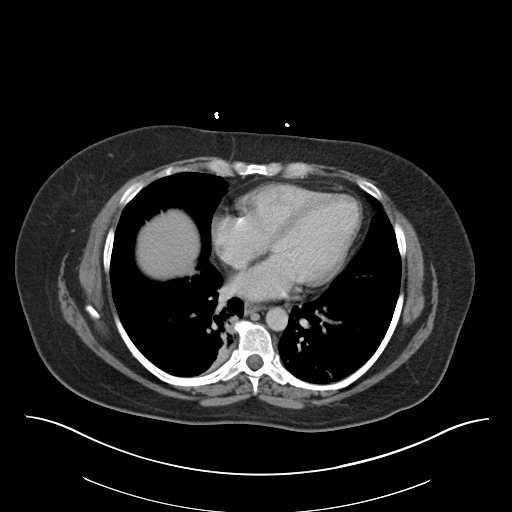

[Series 5: coronal st · coronal · 0.86mm/px · 3 of 114 slices shown]
[im 38/114  soft-tissue]
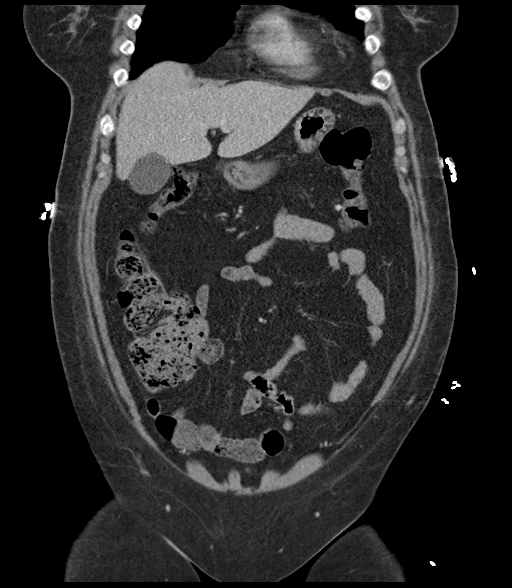
[im 51/114  soft-tissue]
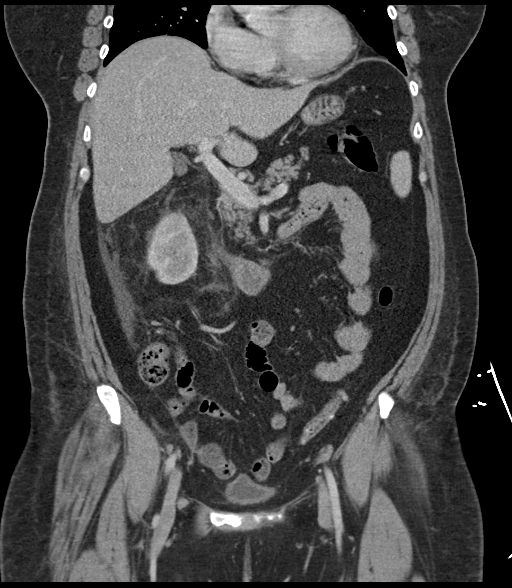
[im 63/114  soft-tissue]
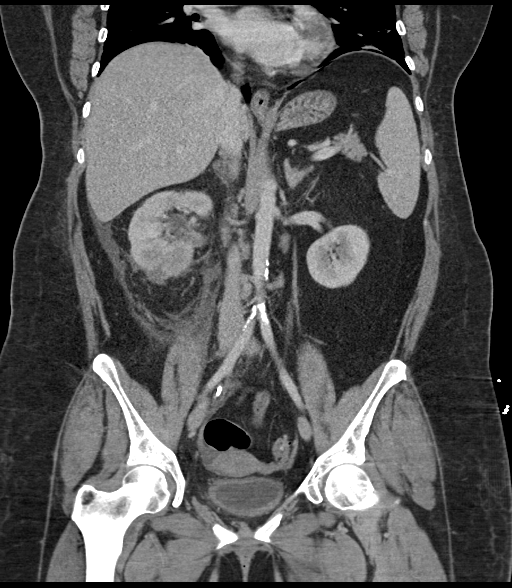

[16 of 46 positions shown; findings below may reference images not displayed]

FINDINGS: Lower chest: Scattered atelectasis in both lower lobes, in the
lingula, and in the right middle lobe. Upper normal heart size.

Hepatobiliary: Unremarkable

Pancreas: Unremarkable

Spleen: Unremarkable

Adrenals/Urinary Tract: Adrenal glands normal.

Right perirenal stranding with filling defects in the right renal
collecting system probably reflecting blood products. A right
double-J ureteral stent is present proximal loop in the collecting
system, and distal loop in the urinary bladder. Focal hypodensity
along the inferior pole of the right kidney is likely inflammatory
and probably relates to recent nephrostomy. Trace amount of gas
tracking along the right posterior perirenal fascia and along the
nephrostomy tract, likely incidental and without a characteristic
appearance for abscess. No findings of preferential excretion of
contrast along the nephrostomy tract.

Duplicated left renal collecting system.

Stomach/Bowel: Descending and sigmoid colon diverticulosis.

Vascular/Lymphatic: Aortoiliac atherosclerotic vascular disease.
Small retroperitoneal lymph nodes are not pathologically enlarged by
size criteria.

Reproductive: Unremarkable

Other: Small amount of fluid in the right paracolic gutter.

Musculoskeletal: Unremarkable
IMPRESSION: 1. Filling defects taking of much of the right renal collecting
system probably reflecting blood products. The proximal loop of the
double-J ureteral stent is coiled within this region.
2. Hypodensity in the right kidney lower pole compatible with focal
inflammation along the site of nephrostomy. This is not an
unexpected appearance.
3. Duplicated left renal collecting system.
4. Scattered atelectasis in the lung bases.
5. Descending and sigmoid colon diverticulosis.
6.  Aortic Atherosclerosis ([id number]).
7. Small amount of fluid in the right paracolic gutter, likely
related to the adjacent inflammation in the perirenal space.

## 2021-02-27 ENCOUNTER — Telehealth (HOSPITAL_BASED_OUTPATIENT_CLINIC_OR_DEPARTMENT_OTHER): Payer: Self-pay | Admitting: General Practice

## 2021-02-27 NOTE — Telephone Encounter (Signed)
LVMTCB regarding the appointment that was made via Mychart for 03/01/21. [employer] and need to let patient know what her apportionment will pertain to, will have to do a cpe/pap in another appointment.

## 2021-03-01 ENCOUNTER — Ambulatory Visit (HOSPITAL_BASED_OUTPATIENT_CLINIC_OR_DEPARTMENT_OTHER): Payer: PRIVATE HEALTH INSURANCE | Admitting: Nurse Practitioner

## 2021-03-04 ENCOUNTER — Other Ambulatory Visit (HOSPITAL_BASED_OUTPATIENT_CLINIC_OR_DEPARTMENT_OTHER): Payer: Self-pay | Admitting: Nurse Practitioner

## 2021-03-04 ENCOUNTER — Encounter (HOSPITAL_BASED_OUTPATIENT_CLINIC_OR_DEPARTMENT_OTHER): Payer: Self-pay | Admitting: Nurse Practitioner

## 2021-03-04 ENCOUNTER — Ambulatory Visit (INDEPENDENT_AMBULATORY_CARE_PROVIDER_SITE_OTHER): Payer: 59 | Admitting: Nurse Practitioner

## 2021-03-04 ENCOUNTER — Other Ambulatory Visit: Payer: Self-pay

## 2021-03-04 VITALS — BP 137/66 | HR 78 | Ht 62.0 in | Wt 192.8 lb

## 2021-03-04 DIAGNOSIS — Z0000 Encounter for general adult medical examination without abnormal findings: Secondary | ICD-10-CM | POA: Insufficient documentation

## 2021-03-04 DIAGNOSIS — N3946 Mixed incontinence: Secondary | ICD-10-CM

## 2021-03-04 DIAGNOSIS — R030 Elevated blood-pressure reading, without diagnosis of hypertension: Secondary | ICD-10-CM

## 2021-03-04 DIAGNOSIS — F32A Depression, unspecified: Secondary | ICD-10-CM

## 2021-03-04 DIAGNOSIS — F419 Anxiety disorder, unspecified: Secondary | ICD-10-CM

## 2021-03-04 DIAGNOSIS — N951 Menopausal and female climacteric states: Secondary | ICD-10-CM

## 2021-03-04 DIAGNOSIS — F5105 Insomnia due to other mental disorder: Secondary | ICD-10-CM | POA: Diagnosis not present

## 2021-03-04 DIAGNOSIS — Z7689 Persons encountering health services in other specified circumstances: Secondary | ICD-10-CM | POA: Diagnosis not present

## 2021-03-04 DIAGNOSIS — F409 Phobic anxiety disorder, unspecified: Secondary | ICD-10-CM

## 2021-03-04 DIAGNOSIS — R635 Abnormal weight gain: Secondary | ICD-10-CM | POA: Diagnosis not present

## 2021-03-04 DIAGNOSIS — Z6830 Body mass index (BMI) 30.0-30.9, adult: Secondary | ICD-10-CM | POA: Insufficient documentation

## 2021-03-04 DIAGNOSIS — I700 Atherosclerosis of aorta: Secondary | ICD-10-CM

## 2021-03-04 DIAGNOSIS — M62830 Muscle spasm of back: Secondary | ICD-10-CM

## 2021-03-04 DIAGNOSIS — Z6835 Body mass index (BMI) 35.0-35.9, adult: Secondary | ICD-10-CM

## 2021-03-04 DIAGNOSIS — I708 Atherosclerosis of other arteries: Secondary | ICD-10-CM

## 2021-03-04 HISTORY — DX: Persons encountering health services in other specified circumstances: Z76.89

## 2021-03-04 HISTORY — DX: Encounter for general adult medical examination without abnormal findings: Z00.00

## 2021-03-04 HISTORY — DX: Abnormal weight gain: R63.5

## 2021-03-04 HISTORY — DX: Muscle spasm of back: M62.830

## 2021-03-04 MED ORDER — ESTROGENS, CONJUGATED 0.625 MG/GM VA CREA: TOPICAL_CREAM | VAGINAL | 3 refills | Status: DC

## 2021-03-04 MED ORDER — FLUOXETINE HCL 10 MG PO CAPS: ORAL_CAPSULE | ORAL | 1 refills | Status: DC

## 2021-03-04 MED ORDER — HYDROXYZINE HCL 50 MG PO TABS: 50.0000 mg | ORAL_TABLET | Freq: Every evening | ORAL | 3 refills | Status: DC | PRN

## 2021-03-04 NOTE — Assessment & Plan Note (Signed)
Routine labs ordered today for CPE with Pap/HPV to be completed in the near future.  Patient to make appointment today for fasting labs prior to CPE appointment. Will evaluated lab results and make changes to plan of care as necessary based on findings.

## 2021-03-04 NOTE — Assessment & Plan Note (Signed)
Anxiety and depression with worsening symptoms since the recent loss of mother and brother. She does have a significant amount going on in her life and has underwent a lot of emotional trauma over the past two years.  She did find improvement with fluoxetine 10mg  from online provider service, but unfortunately the cost of this service was not feasible.  Will restart fluoxetine 10mg  with plan to increase to 20mg  in two weeks and follow-up at CPE in no more than 4 weeks to monitor symptoms and improvement.  I feel that her sleep and weight may improve with better mood management and daily physical activity will help all of these, as well.  She would likely benefit from therapy services and will inquire again about this at next visit.

## 2021-03-04 NOTE — Assessment & Plan Note (Signed)
New patient to establish care.  She has not had routine primary care services in several years.  Overview of health history, family history, medications, social determinants of health, and routine health maintenance performed today.  Recommend follow-up for CPE with PAP and annual labs in the near future.

## 2021-03-04 NOTE — Assessment & Plan Note (Signed)
BP 137/66 today.  She has no known history of hypertension and is not taking any medications.  She does have history of atherosclerosis noted on CT scan from 3 years ago.  Recommend decreasing sodium in diet and increasing daily physical activity. I do think that weight loss would help with this, as well.  Will plan to re-evaluate at next visit to determine if BP is remaining elevated or if this is an intermittent issue.  No warning signs present today to indicate concern.

## 2021-03-04 NOTE — Assessment & Plan Note (Signed)
Insomnia with difficulty falling asleep and staying asleep present due to underlying anxiety and depression symptoms.  I am hopefull once we have her depression and anxiety better controlled she will be able to sleep better.  For now, recommend hydroxyzine 50-100mg  30-45 minutes before bedtime as needed for sleep.  Recommend turning all forms of blue light, including television, cell phone, and computer off at least 2 hours before bedtime and avoiding looking at these devices if awakened in the night as these can make insomnia worse.  Avoid caffeine at least 5 hours before bed and large meals at least 3 hours before bed.  Set room temperature to cooler than usual temperature prior to going to sleep and minimize disruptions.  If symptoms are not improved with changes in habits and medication options, referral to psychiatry for more advanced medication options or CBT may be required.
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
## 2021-03-04 NOTE — Assessment & Plan Note (Signed)
Finding noted on CT in 2019 with no further work-up since that time. Major branch vessels noted to be patent at that time.  No concerning findings or symptoms present today.  Order for labs today and patient to schedule CPE in near future for further evaluation and recommendations.  Recommend low saturated fat and carbohydrate diet with daily physical activity. May need statin, but will determine this once lab results have returned.

## 2021-03-04 NOTE — Assessment & Plan Note (Signed)
Symptoms consistent with muscular spasm of the left sided thoracic region.  No abnormalities noted on palpation today, but tenderness reproducible with deep palpation. Lung clear with no signs of rubs today.  This very well could be coming from the band of her bra pressing into the muscle and nerves of the posterior chest wall. Other etiology includes muscle strain.  Recommend loose fitting bra or loosening band to relieve compression in that area to see if this is helpful.  May apply ice and/or heat to the area for 20 minutes at a time 3 times a day to see if this is helpful for symptoms. May also consider Icy Hot type rub to see if this is helpful.  If symptoms do not improve with recommendations, please let me know and we will evaluate further.

## 2021-03-04 NOTE — Assessment & Plan Note (Signed)
Slow, but steady weight gain over several years. She has improved her diet, but admits that she does not exercise regularly. Weight gain could be related to her post menopausal status, but we will obtain labs to ensure that there is not something else going on.  Recommend increasing physical activity and monitoring diet to reduce saturated fats and carbohydrates.  Discussed that weight loss as we age is often times more difficult, but not unobtainable.  We will see how she does with exercise and dietary changes and make changes to the plan of care as necessary in the future.

## 2021-03-04 NOTE — Assessment & Plan Note (Signed)
Post menopausal vaginal dryness for about the past year with increased mixed urinary incontinence.  Discussed the recommendation against oral estrogen replacement due to significant risks for heart attack and stroke, especially given her family history of CVD.  Discussed the option to utilize premarin cream to the vaginal area with daily use tapering down to once weekly use over about 2 weeks.  She is agreeable to trial this for now. If dosing ineffective may need to consider increased dosing, but strongly recommend lowest dose possible for management of symptoms to prevent side effects.  Follow-up with CPE in a few weeks so we can see how she is doing.

## 2021-03-04 NOTE — Assessment & Plan Note (Signed)
Mixed urge and stress incontinence in post menopausal female. Symptoms of vaginal atrophy are present.  Will begin treatment with low dose premarin cream and send referral to pelvic PT to help with pelvic floor muscle strength.

## 2021-03-04 NOTE — Patient Instructions (Addendum)
Recommendations from today's visit:   You may schedule your annual physical as you check-out today for your earliest convenience.   We will plan to do fasting labs the same day as your appointment.  Please ensure that you have had nothing to eat or drink (except water or black coffee) for at least 8 hours at the time of your labs to make sure that your results are correct.   I have sent the prescription for Prozac to the pharmacy. Start with 21m (one tablet) every morning and after 2 weeks increase to 249m(two tablets). We can follow-up at your physical to see how you are doing on the medication and see if we need to make any changes to the dosing.   I have sent the prescription for the hydroxyzine for sleep. Take one to two tabs about 30-45 minutes before bedtime for best results.   I have sent in premarin cream for vaginal dryness. Start with a pea sized amount applied to the vaginal opening and right inside the vagina daily. You can increase up to a quarter size amount if needed for continued symptoms. After two weeks slowly back off to 3 times a week and then once weekly two weeks later. You can increase back up if symptoms come back.   I sent the referral for pelvic PT, they will call you to schedule  It may take [place] few weeks, but hopefully we will get you feeling better and get your health under better control. Please reach out to me if you have any questions or concerns. ___________________________________________________________________   [facility] you for [facility]. I am excited for the opportunity to partner with you to meet your health care goals. It was a pleasure meeting you today!  I am an Adult-Geriatric Nurse Practitioner with a background in caring for patients for more than 20 years. I received my BaPaediatric nursen Nursing and my Doctor of Nursing Practice degrees at [employer] received additional  fellowship training in primary care and sports medicine after receiving my doctorate degree. I provide primary care and sports medicine services to patients age 6746nd older within this office. I am also a provider with the [facility] the director of the APP Fellowship with [facility] [place] 20 years and am proud to be a member of this community.   I am passionate about providing the best service to you through preventive medicine and supportive care. I consider you a part of the medical team and value your input. I work diligently to ensure that you are heard and your needs are met in a safe and effective manner. I want you to feel comfortable with me as your provider and want you to know that your health concerns are important to me.   For your information, our office hours are Monday- Friday 8:00 AM - 5:00 PM At this time I am not in the office on Wednesdays.  If you have questions or concerns, please call our office at [phone] send [place] MyChart message and we will respond as quickly as possible.   For all urgent or time sensitive needs we ask that you please call the office to avoid delays. MyChart is not constantly monitored and replies may take up to 72 business hours.  MyChart Policy: .  MyChart allows for you to see your visit notes, after visit summary, provider recommendations, lab and tests results, make an appointment, request refills, and contact your provider or the office for non-urgent questions or concerns.  . Providers are seeing patients during normal business hours and do not have built in time to review MyChart messages. We ask that you allow a minimum of 72 business hours for MyChart message responses.  . Complex MyChart concerns may require a visit. Your provider may request you schedule a virtual or in person visit to ensure we are providing the  best care possible. . MyChart messages sent after 4:00 PM on Friday will not be received by the provider until Monday morning.    Lab and Test Results: . You will receive your lab and test results on MyChart as soon as they are completed and results have been sent by the lab or testing facility. Due to this service, you will receive your results BEFORE your provider.  . Please allow a minimum of 72 business hours for your provider to receive and review lab and test results and contact you about.   . Most lab and test result comments from the provider will be sent through [place]. Your provider may recommend changes to the plan of care, follow-up visits, repeat testing, ask questions, or request an office visit to discuss these results. You may reply directly to this message or call the office at [phone] to provide information for the provider or set up an appointment. . In some instances, you will be called with test results and recommendations. Please let us know if this is preferred and we will make note of this in your chart to provide this for you.    . If you have not heard a response to your lab or test results in 72 business hours, please call the office to let us know.   After Hours: . For all non-emergency after hours needs, please call the office at [phone] and select the option to reach the on-call provider service. On-call services are shared between multiple [place] offices and therefore it will not be possible to speak directly with your provider. On-call providers may provide medical advice and recommendations, but are unable to provide refills for maintenance medications.  . For all emergency or urgent medical needs after normal business hours, we recommend that you seek care at the closest Urgent Care or Emergency Department to ensure appropriate treatment in a timely manner.  [facility] has a 24 hour emergency room located on the ground floor for  your convenience.    Please do not hesitate to reach out to [place] with concerns.   Thank you, again, for choosing me as your health care partner. I appreciate your trust and look forward to learning more about you.   Worthy Keeler, DNP, AGNP-c __________________________________________________________________________________ I have ordered the following labs for you today. If you have MyChart, your labs will appear in your MyChart account once they have resulted. Please allow up to 72 business hours for me to review them and respond to the results. If you have not heard anything after 3 business days, please feel free to call the office at [phone] to check on these. Thank you.  CBC- Complete Blood Count-  This measures your red blood cells and white blood cells.  This gives me an idea if you are experiencing anemia or showing signs of infection. This is also used to monitor certain medications and  chronic diseases.  If you levels are off, we may need to add additional testing to help determine the cause and how to best manage this. We will let you know if we need to add additional labs.   CMP- Complete Metabolic Panel- This measures your kidney function, liver function, electrolytes, and your blood sugar.  This gives me an idea if you are having a problem with your electrolytes, kidneys, liver, or blood sugar. This is also used to help monitor certain medications and chronic diseases.  If your levels are off, we may need to add additional testing to help determine the cause and how to best manage this. We will let you know if we need to add additional labs or testing.    Lipids This measures the amount of cholesterol in your blood.  This gives me an idea if you are at risk of heart attack, stroke, or damage to your organs from too much cholesterol. This is also used to help monitor certain medications and chronic diseases.  If your levels are off, I may recommend dietary or medication  changes based on the levels to help reduce your risks.   Hepatitis C This tests for the hepatitis c antibody presence and is recommended as one time testing for all adults. Additional testing is recommended for patients who may be considered high risk to exposure or after known or suspected exposure.  Early diagnosis and treatment are essential to help prevent long term damage from hepatitis C, therefore testing prior to symptoms is always recommended.  If this test is positive, there are medication options available for the treatment of Hepatitis C. We will contact you to set up an appointment to discuss these options and recommendations.   Additional Lab Tests Ordered: Hemoglobin A1c This test gives me an average of your blood sugars over the past three months and helps to detect the presence of elevated blood sugars, pre-diabetes, or diabetes.  Early diagnoses and treatment are essential to help protect you against complications from elevated blood sugars over time.  This test is recommended for all patients over 73 years old and others based on health and family history.  __________________________________________________________________________________   Health Maintenance Recommendations Screening Testing  Mammogram  Every 1 -2 years based on history and risk factors  Starting at age 2  Pap Smear  Ages 21-39 every 3 years  Ages 74-65 every 5 years with HPV testing  More frequent testing may be required based on results and history  Colon Cancer Screening  Every 1-10 years based on test performed, risk factors, and history  Adults aged 63-54 years old  Bone Density Screening  Every 2-10 years based on history  Starting at age 69 for women  Recommendations for men and younger women differ based on medication usage, history, and risk factors  AAA Screening  One time ultrasound  Men 91-23 years old who have every smoked  Lung Cancer Screening  Low Dose Lung CT  every 12 months  Age 47-80 years with a 30 pack-year smoking history who still smoke or who have quit within the last 15 years  Screening Labs  Routine  Labs: Complete Blood Count (CBC), Complete Metabolic Panel (CMP), Cholesterol (Lipid Panel)  Every 6-12 months based on history and medications  May be recommended more frequently based on current conditions or previous results  Hemoglobin A1c Lab  Every 3-12 months based on history and previous results  Starting at age 47 or earlier with diagnosis of diabetes,  high cholesterol, BMI >26, and/or risk factors  Frequent monitoring for patients with diabetes to ensure blood sugar control  Thyroid Panel (TSH w/ T3 & T4)  Every 6 months based on history, symptoms, and risk factors  May be repeated more often if on medication  HIV  One time testing for all patients 34 and older  May be repeated more frequently for patients with increased risk factors or exposure  Hepatitis C  One time testing for all patients 65 and older  May be repeated more frequently for patients with increased risk factors or exposure  Gonorrhea, Chlamydia  Every 12 months for all sexually active persons 13-24 years  Additional monitoring may be recommended for those who are considered high risk or who have symptoms  PSA  Men 83-25 years old with risk factors  Additional screening may be recommended from age 62-69 based on risk factors, symptoms, and history  Vaccine Recommendations  Tetanus Booster  All adults every 10 years  Flu Vaccine  All patients 6 months and older every year  COVID Vaccine  All patients 12 years and older  Initial dosing with booster  May recommend additional booster based on age and health history  HPV Vaccine  2 doses all patients age 70-26  Dosing may be considered for patients over 26  Shingles Vaccine (Shingrix)  2 doses all adults 83 years and older  Pneumonia (Pneumovax 23)  All adults 53  years and older  May recommend earlier dosing based on health history  Pneumonia (Prevnar 86)  All adults 54 years and older  Dosed 1 year after Pneumovax 23  Additional Screening, Testing, and Vaccinations may be recommended on an individualized basis based on family history, health history, risk factors, and/or exposure.   ____________________________________________________________________________  I recommend that all adults get at least 20 minutes of moderate physical activity that elevates your heart rate at least 5 days out of the week.  Some examples include: . Walking or jogging at a pace that allows you to carry on a conversation . Cycling (stationary bike or outdoors) . Water aerobics . Yoga . Weight lifting . Dancing If physical limitations prevent you from putting stress on your joints, exercise in a pool or seated in a chair are excellent options.  Do determine your MAXIMUM heart rate for activity: YOUR AGE - 220 = MAX HeartRate   Remember! . Do not push yourself too hard.  . Start slowly and build up your pace, speed, weight, time in exercise, etc.  . Allow your body to rest between exercise and get good sleep. . You will need more water than normal when you are exerting yourself. Do not wait until you are thirsty to drink. Drink with a purpose of getting in at least 8, 8 ounce glasses of water a day plus more depending on how much you exercise and sweat.    If you begin to develop dizziness, chest pain, abdominal pain, jaw pain, shortness of breath, headache, vision changes, lightheadedness, or other concerning symptoms, stop the activity and allow your body to rest. If your symptoms are severe, seek emergency evaluation immediately. If your symptoms are concerning, but not severe, please let us know so that we can recommend further evaluation.  ___________________________________________________________________________  I recommend that all patients maintain a  diet low in saturated fats, carbohydrates, and cholesterol. While this can be challenging at first, it is not impossible and small changes can make big differences.  Things to try: Marland Kitchen Decreasing the  amount of soda, sweet tea, and/or juice to one or less per day and replace with water o While water is always the first choice, if you do not like water you may consider - adding a water additive without sugar to improve the taste - other sugar free drinks . Replace potatoes with a brightly colored vegetable at dinner . Use healthy oils, such as canola oil or olive oil, instead of butter or hard margarine . Limit your bread intake to two pieces or less a day . Replace regular pasta with low carb pasta options . Bake, broil, or grill foods instead of frying . Monitor portion sizes  . Eat smaller, more frequent meals throughout the day instead of large meals  An important thing to remember is, if you love foods that are not great for your health, you don't have to give them up completely. Instead, allow these foods to be a reward when you have done well. Allowing yourself to still have special treats every once in a while is a nice way to tell yourself thank you for working hard to keep yourself healthy.   Also remember that every day is a new day. If you have a bad day and "fall off the wagon", you can still climb right back up and keep moving along on your journey!  We have resources available to help you!  Some websites that may be helpful include: . www.http://carter.biz/  . Www.VeryWellFit.com __________________________________________________________________________________

## 2021-03-04 NOTE — Assessment & Plan Note (Signed)
BMI 35.26 at office visit today.  Recommend dietary changes with purposeful reduction in saturated fats and carbohydrates, increase water consumption to at least 64 ounces per day, and adding daily physical activity of at least 20 minutes per day 5 days a week with increasing speed and distance over time to help with weight loss.  Will obtain labs for further evaluation of needs.  Consider medication management if diet and lifestyle changes are not effective with 6 months of consistent work. Will follow at CPE

## 2021-03-07 ENCOUNTER — Encounter (HOSPITAL_BASED_OUTPATIENT_CLINIC_OR_DEPARTMENT_OTHER): Payer: Self-pay | Admitting: Nurse Practitioner

## 2021-03-08 ENCOUNTER — Other Ambulatory Visit: Payer: Self-pay

## 2021-03-08 ENCOUNTER — Other Ambulatory Visit (HOSPITAL_BASED_OUTPATIENT_CLINIC_OR_DEPARTMENT_OTHER)
Admission: RE | Admit: 2021-03-08 | Discharge: 2021-03-08 | Disposition: A | Discharge status: Home / Self Care | Payer: 59 | Source: Ambulatory Visit | Attending: Nurse Practitioner | Admitting: Nurse Practitioner

## 2021-03-08 DIAGNOSIS — R635 Abnormal weight gain: Secondary | ICD-10-CM | POA: Diagnosis present

## 2021-03-08 DIAGNOSIS — F32A Depression, unspecified: Secondary | ICD-10-CM | POA: Insufficient documentation

## 2021-03-08 DIAGNOSIS — Z0000 Encounter for general adult medical examination without abnormal findings: Secondary | ICD-10-CM | POA: Diagnosis present

## 2021-03-08 DIAGNOSIS — F419 Anxiety disorder, unspecified: Secondary | ICD-10-CM | POA: Diagnosis present

## 2021-03-08 LAB — LIPID PANEL
Cholesterol: 267 mg/dL — ABNORMAL HIGH (ref 0–200)
HDL: 38 mg/dL — ABNORMAL LOW (ref >40)
LDL Cholesterol: UNDETERMINED mg/dL (ref 0–99)
Total CHOL/HDL Ratio: 7 RATIO
Triglycerides: 555 mg/dL — ABNORMAL HIGH (ref <150)
VLDL: UNDETERMINED mg/dL (ref 0–40)

## 2021-03-08 LAB — HEMOGLOBIN A1C
Hgb A1c MFr Bld: 9.6 % — ABNORMAL HIGH (ref 4.8–5.6)
Mean Plasma Glucose: 228.82 mg/dL

## 2021-03-08 LAB — COMPREHENSIVE METABOLIC PANEL
ALT: 34 U/L (ref 0–44)
AST: 27 U/L (ref 15–41)
Albumin: 4.3 g/dL (ref 3.5–5.0)
Alkaline Phosphatase: 132 U/L — ABNORMAL HIGH (ref 38–126)
Anion gap: 10 (ref 5–15)
BUN: 13 mg/dL (ref 6–20)
CO2: 23 mmol/L (ref 22–32)
Calcium: 9.1 mg/dL (ref 8.9–10.3)
Chloride: 103 mmol/L (ref 98–111)
Creatinine, Ser: 0.62 mg/dL (ref 0.44–1.00)
GFR, Estimated: >60 mL/min (ref >60)
Glucose, Bld: 256 mg/dL — ABNORMAL HIGH (ref 70–99)
Potassium: 3.8 mmol/L (ref 3.5–5.1)
Sodium: 136 mmol/L (ref 135–145)
Total Bilirubin: 0.5 mg/dL (ref 0.3–1.2)
Total Protein: 7.3 g/dL (ref 6.5–8.1)

## 2021-03-08 LAB — CBC WITH DIFFERENTIAL/PLATELET
Abs Immature Granulocytes: 0.03 10*3/uL (ref 0.00–0.07)
Basophils Absolute: 0.1 10*3/uL (ref 0.0–0.1)
Basophils Relative: 1 %
Eosinophils Absolute: 0.1 10*3/uL (ref 0.0–0.5)
Eosinophils Relative: 2 %
HCT: 40 % (ref 36.0–46.0)
Hemoglobin: 14.2 g/dL (ref 12.0–15.0)
Immature Granulocytes: 0 %
Lymphocytes Relative: 39 %
Lymphs Abs: 3 10*3/uL (ref 0.7–4.0)
MCH: 30.1 pg (ref 26.0–34.0)
MCHC: 35.5 g/dL (ref 30.0–36.0)
MCV: 84.9 fL (ref 80.0–100.0)
Monocytes Absolute: 0.4 10*3/uL (ref 0.1–1.0)
Monocytes Relative: 5 %
Neutro Abs: 4.1 10*3/uL (ref 1.7–7.7)
Neutrophils Relative %: 53 %
Platelets: 276 10*3/uL (ref 150–400)
RBC: 4.71 MIL/uL (ref 3.87–5.11)
RDW: 11.6 % (ref 11.5–15.5)
WBC: 7.7 10*3/uL (ref 4.0–10.5)
nRBC: 0 % (ref 0.0–0.2)

## 2021-03-08 LAB — LDL CHOLESTEROL, DIRECT: Direct LDL: 96.5 mg/dL (ref 0–99)

## 2021-03-08 LAB — HEPATITIS C ANTIBODY: HCV Ab: NONREACTIVE

## 2021-03-09 LAB — THYROID PANEL WITH TSH
Free Thyroxine Index: 2 (ref 1.2–4.9)
T3 Uptake Ratio: 27 % (ref 24–39)
T4, Total: 7.4 ug/dL (ref 4.5–12.0)
TSH: 1.41 u[IU]/mL (ref 0.450–4.500)

## 2021-03-11 ENCOUNTER — Encounter (HOSPITAL_BASED_OUTPATIENT_CLINIC_OR_DEPARTMENT_OTHER): Payer: Self-pay | Admitting: Nurse Practitioner

## 2021-03-11 ENCOUNTER — Other Ambulatory Visit: Payer: Self-pay

## 2021-03-11 ENCOUNTER — Other Ambulatory Visit (HOSPITAL_COMMUNITY)
Admission: RE | Admit: 2021-03-11 | Discharge: 2021-03-11 | Disposition: A | Discharge status: Home / Self Care | Payer: 59 | Source: Ambulatory Visit | Attending: Nurse Practitioner | Admitting: Nurse Practitioner

## 2021-03-11 ENCOUNTER — Ambulatory Visit (INDEPENDENT_AMBULATORY_CARE_PROVIDER_SITE_OTHER): Payer: 59 | Admitting: Nurse Practitioner

## 2021-03-11 VITALS — BP 128/75 | HR 89 | Ht 62.0 in | Wt 191.2 lb

## 2021-03-11 DIAGNOSIS — Z0000 Encounter for general adult medical examination without abnormal findings: Secondary | ICD-10-CM

## 2021-03-11 DIAGNOSIS — I700 Atherosclerosis of aorta: Secondary | ICD-10-CM

## 2021-03-11 DIAGNOSIS — N951 Menopausal and female climacteric states: Secondary | ICD-10-CM | POA: Diagnosis not present

## 2021-03-11 DIAGNOSIS — I708 Atherosclerosis of other arteries: Secondary | ICD-10-CM

## 2021-03-11 DIAGNOSIS — F419 Anxiety disorder, unspecified: Secondary | ICD-10-CM

## 2021-03-11 DIAGNOSIS — F32A Depression, unspecified: Secondary | ICD-10-CM

## 2021-03-11 DIAGNOSIS — Z6835 Body mass index (BMI) 35.0-35.9, adult: Secondary | ICD-10-CM

## 2021-03-11 DIAGNOSIS — E1165 Type 2 diabetes mellitus with hyperglycemia: Secondary | ICD-10-CM

## 2021-03-11 DIAGNOSIS — Z01419 Encounter for gynecological examination (general) (routine) without abnormal findings: Secondary | ICD-10-CM

## 2021-03-11 DIAGNOSIS — E782 Mixed hyperlipidemia: Secondary | ICD-10-CM

## 2021-03-11 DIAGNOSIS — F5105 Insomnia due to other mental disorder: Secondary | ICD-10-CM

## 2021-03-11 DIAGNOSIS — Z1231 Encounter for screening mammogram for malignant neoplasm of breast: Secondary | ICD-10-CM

## 2021-03-11 DIAGNOSIS — N3946 Mixed incontinence: Secondary | ICD-10-CM

## 2021-03-11 DIAGNOSIS — F409 Phobic anxiety disorder, unspecified: Secondary | ICD-10-CM

## 2021-03-11 HISTORY — DX: Mixed hyperlipidemia: E78.2

## 2021-03-11 HISTORY — DX: Encounter for general adult medical examination without abnormal findings: Z00.00

## 2021-03-11 HISTORY — DX: Encounter for gynecological examination (general) (routine) without abnormal findings: Z01.419

## 2021-03-11 MED ORDER — METFORMIN HCL 1000 MG PO TABS: 1000.0000 mg | ORAL_TABLET | Freq: Two times a day (BID) | ORAL | 3 refills | Status: DC

## 2021-03-11 MED ORDER — BLOOD GLUCOSE MONITOR KIT: PACK | 99 refills | Status: DC

## 2021-03-11 MED ORDER — METFORMIN HCL 500 MG PO TABS: ORAL_TABLET | ORAL | 0 refills | Status: DC

## 2021-03-11 MED ORDER — ESTRADIOL 0.1 MG/GM VA CREA: TOPICAL_CREAM | VAGINAL | 12 refills | Status: DC

## 2021-03-11 NOTE — Assessment & Plan Note (Signed)
Pap with HPV provided today. There is evidence of vaginal atrophy with erythema and tenderness noted on speculum exam and bimanual exam. Estradiol cream prescribed for vaginal symptoms. Plan to follow up if symptoms worsen or fail to improve with treatment.

## 2021-03-11 NOTE — Assessment & Plan Note (Signed)
New diagnosis of type 2 diabetes mellitus with elevated fasting blood glucose readings. Discussion and education on new diagnosis with patient today with information about recommendations and medication management as well as lifestyle changes, diet, and exercise.  We will plan to start Metformin 500 mg once a day with a taper dose up to 2000 mg total per day over the next 4 weeks. Recommendations for dietary changes provided verbally and in writing to patient for review. She is not currently on any statin therapy however will wait to begin this if needed once she has stabilized on the Metformin to avoid beginning too many new medications at the same time. I am hopeful with better control of her blood sugars she will also see reduction in weight and lowering of her lipids. Significant counseling provided for recommendations with questions answered. Plan to follow-up in 3 months for repeat labs

## 2021-03-11 NOTE — Assessment & Plan Note (Signed)
Recent laboratory results reveal significant elevation of triglycerides and decreased HDL.  Unable to calculate LDL due to triglyceride level. Discussed with patient the risks that hyperlipidemia present especially in the setting of diabetes. She would definitely benefit from starting a statin for therapy however we are starting Metformin today and she is interested in trying to lower these numbers with diet exercise and better blood sugar management. She does have a history of atherosclerosis detected on CT scan in 1999.  Discussed that her risks of heart attack and stroke are increased with known atherosclerosis, diabetes, and elevated lipids and triglyceride levels. Will discuss with patient again option to start medication management if levels are not under control at 34-month follow-up.  Either way it is recommended that she start a low-dose statin given her current diabetes diagnosis.  In an effort not to overwhelm the patient with a large volume of drastic changes we will address these issues at each visit. Follow-up in 3 months for repeat labs.

## 2021-03-11 NOTE — Assessment & Plan Note (Signed)
Annual physical exam with Pap today. Labs were performed prior to the visit. Significant amount of time spent discussing medication, lifestyle, and diet management options needed to help control with chronic medical needs. No significant findings present today but will require follow-ups approximately every 3 months for her diabetes, weight, anxiety, depression, and hyperlipidemia. Plan to follow-up in approximately 3 months for chronic illnesses and in 1 year for CPE.

## 2021-03-11 NOTE — Assessment & Plan Note (Signed)
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 Unfortunately Premarin cream was not covered by her health insurance company and was going to be over $400 out-of-pocket.  Prescription changed to estradiol cream with instructions to use pea-sized daily and increase amount until symptoms are controlled up to 1 full applicator per day. She does have evidence of vaginal atrophy on pelvic exam today.  Coupon provided for Premarin cream in the event that estradiol is not covered either. I am hopeful that some of the symptoms will resolve when her blood sugars are a little more under control given that she has a new diagnosis of diabetes. We will plan to follow-up in approximately 3 months.

## 2021-03-11 NOTE — Assessment & Plan Note (Signed)
Referral for mammogram placed today.  She has never had a mammogram. Discussed with patient that imaging will call to schedule this. Evidence of fibrotic breasts on examination today.

## 2021-03-11 NOTE — Assessment & Plan Note (Signed)
She was started on fluoxetine approximately 1 week ago for depression and anxiety symptoms.  She was also started on hydroxyzine to help with anxiety and sleep. She does report that she is sleeping better her anxiety is improved.  Her depression symptoms appear to be improved based on her PHQ-9 results. We will plan to continue with fluoxetine with the increase to 20 mg next week. I do have concerns that her recent diagnosis of diabetes and hyperlipidemia may have a negative effect on her mood status however I feel certain that she will be able to manage these effectively as she is motivated and willing to make changes necessary for excellent results. We will plan to follow-up in approximately 3 months.

## 2021-03-11 NOTE — Progress Notes (Addendum)
BP 128/75   Pulse 89   Ht 5' 2"  (1.575 m)   Wt 191 lb 3.2 oz (86.7 kg)   SpO2 97%   BMI 34.97 kg/m    Subjective:    Patient ID: Haley Charles, female    DOB: 12/18/1970, 50 y.o.   MRN: [id number]  HPI: Haley Charles is a 50 y.o. female presenting on 03/11/2021 for comprehensive medical examination.   Current medical concerns include:New diagnosis of T2DM, Hyperlipidemia, Hypertriglyceridemia, Vaginal Atrophy   T2DM New diagnosis of diabetes mellitus. Pre-exam labs reveal hemoglobin A1c of 9.6%. Patient reports that she does have a family history of diabetes in her father and both her brother and her sister. Review of previous labs reveal elevation in blood glucose levels in years past. She reports that her diet consists of overall healthy food options. Since learning of her diabetes diagnosis she tells me that she has gone back to ordering meal services with [employer] to help in the process of healthy meals. She reports that she does not exercise on a regular basis, but plans on starting this. She has had steady weight gain over the past several years.   LTD/Elevated Trg Her recent labs revealed elevated tiglycerides (555) and HLD. She reports that she is very cautious of the oils and fats that she uses in preparing foods and feels that she eats a fairly healthy diet. She does have concerns as she had a CT in 2019 that did show the presence of atherosclerosis of the aortic bifurcation of the common iliac arteries.   Vaginal Atrophy She reports vaginal atrophy symptoms of vaginal dryness, dyspareunia, and mixed incontinence. She was recently prescribed premarin cream to help with symptom management, but this was not covered by her insurance. She is interested in looking into other options to help with these symptoms today.   Depression/Anxiety/Insomnia She reports that she has restarted on her Prozac and has been taking the hydroxyzine to help her sleep. She states that occasionally when she  wakes she will notice a headache that she attributes to the hydroxyzine, but overall the medication is helping her sleep better. She reports some improvement with her mood, however, the medication was just restarted.    Past Medical History:  Past Medical History:  Diagnosis Date  . Acute flank pain 06/18/2018  . Anxiety   . Colon polyp   . Depression   . Diverticulosis   . Encounter to establish care 03/04/2021  . Enteritis    History of in colonoscopy 2006  . Flank pain 06/18/2018  . GERD (gastroesophageal reflux disease)   . History of kidney stones   . Peripheral vascular disease (HCC)    right leg  . PONV (postoperative nausea and vomiting)   . Pyelonephritis 06/23/2018  . Renal calculus, right 06/21/2018   Medications:  Current Outpatient Medications on File Prior to Visit  Medication Sig  . conjugated estrogens (PREMARIN) vaginal cream Apply pea sized to quarter sized amount to vaginal opening and right inside vagina daily. Start with smallest amount and increase if needed for symptoms.  Marland Kitchen FLUoxetine (PROZAC) 10 MG capsule Take one tablet (19m) by mouth every morning. After 2 weeks increase to two tablets (262m by mouth every morning.  . hydrOXYzine (ATARAX/VISTARIL) 50 MG tablet Take 1 tablet (50 mg total) by mouth at bedtime and may repeat dose one time if needed. For sleep.   No current facility-administered medications on file prior to visit.    She currently  lives with: Her boyfriend and one son.  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   She endorses ETOH use of 1-2 drinks per week She denies nictoine use  She denies illegal substance use   She is postmenopausal She reports last menstrual period Unknown- several years Current menopausal symptoms: yes: anxiety, depression, insomnia, no energy,  vaginal dryness  She is currently sexually active with single partner, contraception - post menopausal status She denies concerns today about STI  She denies concerns about skin changes today She denies concerns about bowel changes today She endorses concerns about bladder changes today: urgency and stress incontinence  Depression Screen done today and results listed below:  [facility] 03/11/2021 03/04/2021  Decreased Interest 0 0  Down, Depressed, Hopeless 2 2  PHQ - 2 Score 2 2  Altered sleeping 1 3  Tired, decreased energy 2 3  Change in appetite 2 0  Feeling bad or failure about yourself  0 0  Trouble concentrating 3 3  Moving slowly or fidgety/restless 2 1  Suicidal thoughts 0 0  PHQ-9 Score 12 12  Difficult doing work/chores Very difficult Very difficult   Anxiety Screening done and confirms the above findings GAD 7 : Generalized Anxiety Score 03/11/2021 03/04/2021  Nervous, Anxious, on Edge 2 3  Control/stop worrying 2 3  Worry too much - different things 2 3  Trouble relaxing 2 2  Restless 2 1  Easily annoyed or irritable 2 3  Afraid - awful might happen 1 2  Total GAD 7 Score 13 17  Anxiety Difficulty Very difficult Very difficult    She does not have a history of falls. I did complete a risk assessment for falls. A plan of care for falls was not documented. Fall Risk  03/11/2021 03/04/2021 03/04/2021  Falls in the past year? 0 1 -  Number falls in past yr: 0 0 0  Injury with Fall? - 0 -  Risk for fall due to : No Fall Risks No Fall Risks No Fall Risks  Follow up - Falls evaluation completed -      Surgical History:  Past Surgical History:  Procedure Laterality Date  . APPENDECTOMY    . COLONOSCOPY  04/16/2018  . IR URETERAL STENT RIGHT NEW ACCESS W/O SEP NEPHROSTOMY CATH  06/18/2018  . NEPHROLITHOTOMY Right 06/21/2018   Procedure: RIGHT NEPHROLITHOTOMY PERCUTANEOUS;  Surgeon: OKathie Rhodes MD;  Location: [facility];  Service: Urology;  Laterality:  Right;  . TUBAL LIGATION  21 years ago  . UPPER GI ENDOSCOPY      Allergies:  Allergies  Allergen Reactions  . Latex Rash    Social History:  Social History   Socioeconomic History  . Marital status: Significant Other    Spouse name: Bud  . Number of children: 3  . Years of education: Not on file  . Highest education level: Not on file  Occupational History  . Occupation: VP-Heat aTeacher, English as a foreign language Tobacco Use  . Smoking status: Former Smoker    Packs/day: 1.00    Years: 14.00    Pack years: 14.00    Types: Cigarettes    Quit date: 2009    Years since quitting: 13.2  . Smokeless tobacco: Never Used  Vaping Use  . Vaping Use: Never used  Substance and  Sexual Activity  . Alcohol use: Yes    Comment: occassional  . Drug use: Not Currently  . Sexual activity: Yes    Birth control/protection: Surgical  Other Topics Concern  . Not on file  Social History Narrative   She currently lives with her significant other, Bud, and has three adult children of her own and three step-children from Bud. She has 5 grandchildren.    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    Social Determinants of Health   Financial Resource Strain: Not on file  Food Insecurity: Not on file  Transportation Needs: No Transportation Needs  . Lack of Transportation (Medical): No  . Lack of Transportation (Non-Medical): No  Physical Activity: Inactive  . Days of Exercise per Week: 0 days  . Minutes of Exercise per Session: 0 min  Stress: Stress Concern Present  . Feeling of Stress : Very much  Social Connections: Not on file  Intimate Partner Violence: Not At Risk  . Fear of Current or Ex-Partner: No  . Emotionally Abused: No  . Physically Abused: No  . Sexually Abused: No   Social History   Tobacco Use  Smoking Status Former Smoker  . Packs/day: 1.00  . Years: 14.00  . Pack years: 14.00  . Types: Cigarettes  . Quit date: 2009  . Years since quitting: 13.2   Smokeless Tobacco Never Used   Social History   Substance and Sexual Activity  Alcohol Use Yes   Comment: occassional    Family History:  Family History  Problem Relation Age of Onset  . Colon polyps Mother   . Esophagitis Mother   . Esophageal cancer Mother   . Cancer Mother   . Stroke Mother   . CAD Mother   . CAD Father   . Hypertension Father   . Diabetes Mellitus II Father   . Lung cancer Father   . Cancer Father   . Rectal cancer Neg Hx   . Stomach cancer Neg Hx   . Colon cancer Neg Hx     Past medical history, surgical history, medications, allergies, family history and social history reviewed with patient today and changes made to appropriate areas of the chart.   All ROS negative except what is listed above and in the HPI.      Objective:    BP 128/75   Pulse 89   Ht 5' 2"  (1.575 m)   Wt 191 lb 3.2 oz (86.7 kg)   SpO2 97%   BMI 34.97 kg/m   Wt Readings from Last 3 Encounters:  03/11/21 191 lb 3.2 oz (86.7 kg)  03/04/21 192 lb 12.8 oz (87.5 kg)  06/24/18 200 lb 4.8 oz (90.9 kg)    Physical Exam Vitals and nursing note reviewed. Exam conducted with a chaperone present.  Constitutional:      Appearance: Normal appearance. She is obese.  HENT:     Head: Normocephalic.     Right Ear: Tympanic membrane, ear canal and external ear normal.     Left Ear: Tympanic membrane, ear canal and external ear normal.     Nose: Nose normal.     Mouth/Throat:     Mouth: Mucous membranes are moist.     Pharynx: Oropharynx is clear. No oropharyngeal exudate or posterior oropharyngeal erythema.  Eyes:     Extraocular Movements: Extraocular movements intact.     Conjunctiva/sclera: Conjunctivae normal.     Pupils: Pupils are equal, round, and reactive  to light.  Neck:     Vascular: No carotid bruit.  Cardiovascular:     Rate and Rhythm: Normal rate and regular rhythm.     Pulses: Normal pulses.          Dorsalis pedis pulses are 2+ on the right side and 2+ on the  left side.       Posterior tibial pulses are 2+ on the right side and 2+ on the left side.     Heart sounds: Normal heart sounds. No murmur heard.   Pulmonary:     Effort: Pulmonary effort is normal.     Breath sounds: Normal breath sounds.  Abdominal:     General: Abdomen is flat. Bowel sounds are normal. There is no distension.     Palpations: Abdomen is soft.     Tenderness: There is no abdominal tenderness. There is no right CVA tenderness or left CVA tenderness.  Genitourinary:    General: Normal vulva.     Exam position: Lithotomy position.     Labia:        Right: No tenderness.        Left: No tenderness.      Urethra: No prolapse, urethral pain or urethral swelling.     Vagina: No vaginal discharge or erythema.     Cervix: Friability present. No cervical motion tenderness or erythema.     Uterus: Tender.      Adnexa:        Right: Tenderness present.        Left: Tenderness present.      Rectum: Normal.     Comments: Atrophy present of labia minora with pale coloration to vaginal canal and cervix. Tenderness noted with bimanual exam in bilateral adnexa as well as uterine tenderness with no abnormalities palpated.  Musculoskeletal:        General: Normal range of motion.     Cervical back: Normal range of motion. No rigidity.     Right lower leg: No edema.     Left lower leg: No edema.  Feet:     Right foot:     Skin integrity: Skin integrity normal.     Toenail Condition: Right toenails are normal.     Left foot:     Skin integrity: Skin integrity normal.     Toenail Condition: Left toenails are normal.  Lymphadenopathy:     Cervical: No cervical adenopathy.     Lower Body: No right inguinal adenopathy. No left inguinal adenopathy.  Skin:    General: Skin is dry.     Capillary Refill: Capillary refill takes less than 2 seconds.  Neurological:     General: No focal deficit present.     Mental Status: She is alert and oriented to person, place, and time.      Cranial Nerves: No cranial nerve deficit.     Sensory: No sensory deficit.     Motor: No weakness.     Coordination: Coordination normal.     Gait: Gait normal.  Psychiatric:        Mood and Affect: Mood normal.        Behavior: Behavior normal.        Thought Content: Thought content normal.        Judgment: Judgment normal.     Results for orders placed or performed during the hospital encounter of 03/08/21  Thyroid Panel With TSH  Result Value Ref Range   TSH 1.410 0.450 - 4.500 uIU/mL  T4, Total 7.4 4.5 - 12.0 ug/dL   T3 Uptake Ratio 27 24 - 39 %   Free Thyroxine Index 2.0 1.2 - 4.9  Hepatitis C Antibody  Result Value Ref Range   HCV Ab NON REACTIVE NON REACTIVE  HgB A1c  Result Value Ref Range   Hgb A1c MFr Bld 9.6 (H) 4.8 - 5.6 %   Mean Plasma Glucose 228.82 mg/dL  Lipid panel  Result Value Ref Range   Cholesterol 267 (H) 0 - 200 mg/dL   Triglycerides 555 (H) <150 mg/dL   HDL 38 (L) >40 mg/dL   Total CHOL/HDL Ratio 7.0 RATIO   VLDL UNABLE TO CALCULATE IF TRIGLYCERIDE OVER 400 mg/dL 0 - 40 mg/dL   LDL Cholesterol UNABLE TO CALCULATE IF TRIGLYCERIDE OVER 400 mg/dL 0 - 99 mg/dL  Comprehensive metabolic panel  Result Value Ref Range   Sodium 136 135 - 145 mmol/L   Potassium 3.8 3.5 - 5.1 mmol/L   Chloride 103 98 - 111 mmol/L   CO2 23 22 - 32 mmol/L   Glucose, Bld 256 (H) 70 - 99 mg/dL   BUN 13 6 - 20 mg/dL   Creatinine, Ser 0.62 0.44 - 1.00 mg/dL   Calcium 9.1 8.9 - 10.3 mg/dL   Total Protein 7.3 6.5 - 8.1 g/dL   Albumin 4.3 3.5 - 5.0 g/dL   AST 27 15 - 41 U/L   ALT 34 0 - 44 U/L   Alkaline Phosphatase 132 (H) 38 - 126 U/L   Total Bilirubin 0.5 0.3 - 1.2 mg/dL   GFR, Estimated >60 >60 mL/min   Anion gap 10 5 - 15  CBC with Differential/Platelet  Result Value Ref Range   WBC 7.7 4.0 - 10.5 K/uL   RBC 4.71 3.87 - 5.11 MIL/uL   Hemoglobin 14.2 12.0 - 15.0 g/dL   HCT 40.0 36.0 - 46.0 %   MCV 84.9 80.0 - 100.0 fL   MCH 30.1 26.0 - 34.0 pg   MCHC 35.5 30.0 -  36.0 g/dL   RDW 11.6 11.5 - 15.5 %   Platelets 276 150 - 400 K/uL   nRBC 0.0 0.0 - 0.2 %   Neutrophils Relative % 53 %   Neutro Abs 4.1 1.7 - 7.7 K/uL   Lymphocytes Relative 39 %   Lymphs Abs 3.0 0.7 - 4.0 K/uL   Monocytes Relative 5 %   Monocytes Absolute 0.4 0.1 - 1.0 K/uL   Eosinophils Relative 2 %   Eosinophils Absolute 0.1 0.0 - 0.5 K/uL   Basophils Relative 1 %   Basophils Absolute 0.1 0.0 - 0.1 K/uL   Immature Granulocytes 0 %   Abs Immature Granulocytes 0.03 0.00 - 0.07 K/uL  LDL cholesterol, direct  Result Value Ref Range   Direct LDL 96.5 0 - 99 mg/dL      Assessment & Plan:   Problem List Items Addressed This Visit      Cardiovascular and Mediastinum   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 Discussion and education on new diagnosis with patient today with information about recommendations and medication management as well as lifestyle changes, diet, and exercise.  We will plan to start Metformin 500 mg once a day with a taper dose up to 2000 mg total per day  over the next 4 weeks. Recommendations for dietary changes provided verbally and in writing to patient for review. She is not currently on any statin therapy however will wait to begin this if needed once she has stabilized on the Metformin to avoid beginning too many new medications at the same time. I am hopeful with better control of her blood sugars she will also see reduction in weight and lowering of her lipids. Significant counseling provided for recommendations with questions answered. Plan to follow-up in 3 months for repeat labs       Relevant Medications   metFORMIN (GLUCOPHAGE) 500 MG tablet   metFORMIN (GLUCOPHAGE) 1000 MG tablet (Start on 04/10/2021)   blood glucose  meter kit and supplies KIT     Genitourinary   Vaginal dryness, menopausal    Ongoing issue with postmenopausal vaginal dryness and mixed urinary incontinence. Unfortunately Premarin cream was not covered by her health insurance company and was going to be over $400 out-of-pocket.  Prescription changed to estradiol cream with instructions to use pea-sized daily and increase amount until symptoms are controlled up to 1 full applicator per day. She does have evidence of vaginal atrophy on pelvic exam today.  Coupon provided for Premarin cream in the event that estradiol is not covered either. I am hopeful that some of the symptoms will resolve when her blood sugars are a little more under control given that she has a new diagnosis of diabetes. We will plan to follow-up in approximately 3 months.      Relevant Medications   estradiol (ESTRACE VAGINAL) 0.1 MG/GM vaginal cream     Other   Anxiety and depression    She was started on fluoxetine approximately 1 week ago for depression and anxiety symptoms.  She was also started on hydroxyzine to help with anxiety and sleep. She does report that she is sleeping better her anxiety is improved.  Her depression symptoms appear to be improved based on her PHQ-9 results. We will plan to continue with fluoxetine with the increase to 20 mg next week. I do have concerns that her recent diagnosis of diabetes and hyperlipidemia may have a negative effect on her mood status however I feel certain that she will be able to manage these effectively as she is motivated and willing to make changes necessary for excellent results. We will plan to follow-up in approximately 3 months.      Insomnia due to anxiety and fear    Patient reported insomnia has improved with use of hydroxyzine.  She does report that occasionally she will wake up with a headache after using the hydroxyzine.  Discussed with the patient that she can cut the dose in half to see if this helps her  sleep and reduces the headaches present. Follow-up if symptoms worsen or fail to improve.      Mixed stress and urge urinary incontinence    Considering new diagnosis of diabetes some of the incontinence that she has been experiencing may be due to elevated blood glucose readings and increased urination. She has had a referral for pelvic floor PT and we will also start estradiol cream for vaginal atrophy. Recommend follow-up if symptoms worsen or treatments are not effective.      BMI 35.0-35.9,adult    BMI has improved since last office visit. With new diagnosis of diabetes and hyperlipidemia we discussed at length diet, exercise, and lifestyle changes to help to lower her risks of cardiovascular disease and chronic long-term complications from her  chronic illnesses. Strongly encourage beginning activity of at least 20 minutes a day walking and increasing as necessary. We will also plan to start Metformin today for diabetes and I am hopeful that this will help with her weight management as well.  She is also starting to use meal preparation plan once again that has been effective for her in the past and will likely also help with her diabetes. If dietary and lifestyle changes are not effective within the next 6 months then we can consider a GLP-1 addition to her Metformin to help with weight management as well. Follow-up in approximately 3 months.      Annual physical exam - Primary    Annual physical exam with Pap today. Labs were performed prior to the visit. Significant amount of time spent discussing medication, lifestyle, and diet management options needed to help control with chronic medical needs. No significant findings present today but will require follow-ups approximately every 3 months for her diabetes, weight, anxiety, depression, and hyperlipidemia. Plan to follow-up in approximately 3 months for chronic illnesses and in 1 year for CPE.       Relevant Orders   MM 3D SCREEN  BREAST BILATERAL   Encounter for annual routine gynecological examination    Pap with HPV provided today. There is evidence of vaginal atrophy with erythema and tenderness noted on speculum exam and bimanual exam. Estradiol cream prescribed for vaginal symptoms. Plan to follow up if symptoms worsen or fail to improve with treatment.      Relevant Orders   MM 3D SCREEN BREAST BILATERAL   Cytology - PAP   Mixed hyperlipidemia    Recent laboratory results reveal significant elevation of triglycerides and decreased HDL.  Unable to calculate LDL due to triglyceride level. Discussed with patient the risks that hyperlipidemia present especially in the setting of diabetes. She would definitely benefit from starting a statin for therapy however we are starting Metformin today and she is interested in trying to lower these numbers with diet exercise and better blood sugar management. She does have a history of atherosclerosis detected on CT scan in 1999.  Discussed that her risks of heart attack and stroke are increased with known atherosclerosis, diabetes, and elevated lipids and triglyceride levels. Will discuss with patient again option to start medication management if levels are not under control at 29-monthfollow-up.  Either way it is recommended that she start a low-dose statin given her current diabetes diagnosis.  In an effort not to overwhelm the patient with a large volume of drastic changes we will address these issues at each visit. Follow-up in 3 months for repeat labs.      Screening mammogram for breast cancer    Referral for mammogram placed today.  She has never had a mammogram. Discussed with patient that imaging will call to schedule this. Evidence of fibrotic breasts on examination today.          Follow up plan: Return in 3 months (on 06/10/2021) for DM/HLD.   LABORATORY TESTING:  - Pap smear: pap done - STI testing: deferred  IMMUNIZATIONS:   - Tdap: Tetanus  vaccination status reviewed: will need records review to evaluate when due. - Influenza: Postponed to flu season - Pneumovax: Will discuss at DM follow-up visit - Prevnar: Not applicable - HPV: Not applicable - Zostavax vaccine: Not applicable  SCREENING: -Mammogram: Ordered today  - Colonoscopy: Up to date  - Bone Density: Not applicable  -Hearing Test: Not applicable  -Spirometry: Not  applicable   PATIENT COUNSELING:   For all adult patients, I recommend   A well balanced diet low in saturated fats, cholesterol, and moderation in carbohydrates.   This can be as simple as monitoring portion sizes and cutting back on sugary beverages such as soda and  juice to start with.    Daily water consumption of at least 64 ounces.  Physical activity at least 180 minutes per week, if just starting out.   This can be as simple as taking the stairs instead of the elevator and walking 2-3 laps around the office  purposefully every day.   STD protection, partner selection, and regular testing if high risk.  Limited consumption of alcoholic beverages if alcohol is consumed.  For women, I recommend no more than 7 alcoholic beverages per week, spread out throughout the week.  Avoid "binge" drinking or consuming large quantities of alcohol in one setting.   Please let me know if you feel you may need help with reduction or quitting alcohol consumption.   Avoidance of nicotine, if used.  Please let me know if you feel you may need help with reduction or quitting nicotine use.   Daily mental health attention.  This can be in the form of 5 minute daily meditation, prayer, journaling, yoga, reflection, etc.   Purposeful attention to your emotions and mental state can significantly improve your overall wellbeing  and  Health.  Please know that I am here to help you with all of your health care goals and am happy to work with you to find a solution that works best for you.  The greatest advice I have  received with any changes in life are to take it one step at a time, that even means if all you can focus on is the next 60 seconds, then do that and celebrate your victories.  With any changes in life, you will have set backs, and that is OK. The important thing to remember is, if you have a set back, it is not a failure, it is an opportunity to try again!  Health Maintenance Recommendations Screening Testing  Mammogram  Every 1 -2 years based on history and risk factors  Starting at age 25  Pap Smear  Ages 21-39 every 3 years  Ages 66-65 every 5 years with HPV testing  More frequent testing may be required based on results and history  Colon Cancer Screening  Every 1-10 years based on test performed, risk factors, and history  Starting at age 62  Bone Density Screening  Every 2-10 years based on history  Starting at age 28 for women  Recommendations for men differ based on medication usage, history, and risk factors  AAA Screening  One time ultrasound  Men 68-67 years old who have every smoked  Lung Cancer Screening  Low Dose Lung CT every 12 months  Age 33-80 years with a 30 pack-year smoking history who still smoke or who have quit within the last 15 years  Screening Labs  Routine  Labs: Complete Blood Count (CBC), Complete Metabolic Panel (CMP), Cholesterol (Lipid Panel)  Every 6-12 months based on history and medications  May be recommended more frequently based on current conditions or previous results  Hemoglobin A1c Lab  Every 3-12 months based on history and previous results  Starting at age 17 or earlier with diagnosis of diabetes, high cholesterol, BMI >26, and/or risk factors  Frequent monitoring for patients with diabetes to ensure blood sugar control  Thyroid Panel (TSH w/ T3 & T4)  Every 6 months based on history, symptoms, and risk factors  May be repeated more often if on medication  HIV  One time testing for all patients 66 and  older  May be repeated more frequently for patients with increased risk factors or exposure  Hepatitis C  One time testing for all patients 40 and older  May be repeated more frequently for patients with increased risk factors or exposure  Gonorrhea, Chlamydia  Every 12 months for all sexually active persons 13-24 years  Additional monitoring may be recommended for those who are considered high risk or who have symptoms  PSA  Men 48-49 years old with risk factors  Additional screening may be recommended from age 69-69 based on risk factors, symptoms, and history  Vaccine Recommendations  Tetanus Booster  All adults every 10 years  Flu Vaccine  All patients 6 months and older every year  COVID Vaccine  All patients 12 years and older  Initial dosing with booster  May recommend additional booster based on age and health history  HPV Vaccine  2 doses all patients age 43-26  Dosing may be considered for patients over 26  Shingles Vaccine (Shingrix)  2 doses all adults 17 years and older  Pneumonia (Pneumovax 23)  All adults 71 years and older  May recommend earlier dosing based on health history  Pneumonia (Prevnar 50)  All adults 38 years and older  Dosed 1 year after Pneumovax 23  Additional Screening, Testing, and Vaccinations may be recommended on an individualized basis based on family history, health history, risk factors, and/or exposure.   Additional information provided on diabetes, HLD, and elevated triglyceride levels.   Time: 65 minutes, >50% spent counseling, care coordination, chart review, and documentation.     NEXT PREVENTATIVE PHYSICAL DUE IN 1 YEAR. Return in 3 months (on 06/10/2021) for DM/HLD.

## 2021-03-11 NOTE — Patient Instructions (Addendum)
Recommendations from today's visit:    Patients with diabetes need to have a dilated eye exam every year to monitor for changes to the eyes to help prevent blindness that can be caused from elevated blood sugars.   Please let me know if you need a referral to an ophthalmologist to have this completed.    Patients with diabetes are at higher risk of infections and significant complications from infections.   It is strongly recommended that all patients with diabetes receive the annual flu vaccine, COVID-19  vaccines, and the pneumococcal vaccine to help protect you from these complications.   Flu vaccines are recommended each fall- starting in September.  COVID vaccines are recommended at any time with 2 initial doses and at least one booster dose.   Pneumonia 23 vaccine is recommended once before age 19 and a booster dose after age 96.  DIABETES- New Diagnosis Concerns with high blood sugar include increased damage to your organs and nerves, slow wound healing, and increased risk of infection, among many other things. Your risk of heart attack and stroke are significantly higher if you have diabetes with uncontrolled blood sugars.  It is vital that we get your blood sugar levels under control with diet, exercise, and medication to help reduce your risks.   Diet is very important with diabetes. Foods high in carbohydrates (sugars, fruits, starches, bread, pasta, potatoes, soda etc) break down into glucose, which raises your blood sugar. Your body is not able to use the glucose for energy and therefore it stays in your blood stream causing a wide variety of complications. You will need to monitor foods high in carbohydrate and sugar and reduce these to a minimum.  This can feel overwhelming at first, but is manageable.   You should aim for less than 1/2 of your total calorie intake per day to come from carbohydrates. For most people this is a goal of no more than 200g of carbohydrates per day.  Reading labels is very important to help understand how many carbohydrates are in certain foods. There are also tables available online for restaurant foods that may help when you are eating out.   Exercise is also an excellent way to reduce your blood sugars. This forces your cells to use the excess sugar in your blood for energy instead of allowing it to remain in the blood.  Excess fat causes your cells to be resistant to the use of insulin which worsens diabetes. Weight loss can make a huge impact on your diabetes management.  I recommend moderate exercise, like walking, at least 20 minutes every day to help with this.   Monitoring your blood sugars helps you have an understanding of how your diet and activity levels are affecting your blood sugar. Certain foods that you think may not increase your blood sugar really make a difference. By monitoring your sugar when you eat, you can see how different foods affect your numbers. This is very important when you first start treatment to get a good understanding of what foods are good and what foods you should limit.   I would like you to monitor your blood sugar every morning before eating and write down the number to bring with you to your next visit. This will help [place] determine if we need to make changes to the medication and diet.   You may also want to check your blood sugar after meals to see how certain foods are affecting the numbers. Excellent blood sugar goals  are between 90-120 when you have not eaten and less than 160 1-2 hours after a meal. If you are checking your blood sugars 1-2 hours after a meal and they are higher than this, this tells [place] that we need better control. If you are waking up with blood sugars above 120, we need to look at your diet the day before to see if this could be affecting the numbers.   Medication is the key to help control your blood sugars. With diabetes, your body is not using insulin like it should to help the  glucose (sugar) get into the cells for fuel. Medications help your body produce more insulin and make your cells more receptive to insulin so that the glucose in your body can be used instead of remaining in the blood.  The first line medication is called Metformin. This is a pill that you take daily, usually twice a day, to help your body properly use glucose. If good control is not achieved with metformin, other medications can be added or changed to help with better control.   Management of cholesterol is vital to reduce your risks of heart attack and stroke. Medication to control your cholesterol is also very important and necessary for your overall health.  As a new diabetic, we will plan to follow-up every 3 months to check your hemoglobin A1c, which gives me an average of your blood sugars over the past 3 months to help determine your control. Once your blood sugars are well controlled, we can go down to checking every 6 months.  We also need to closely monitor your feet, kidney function, and cholesterol as these are all affected from diabetes.  Follow-Up to be scheduled at checkout: Diabetes Mellitus: 3 month follow-up for labs and evaluation Annual Physical Exam: 1 year   Preventive Care 23-38 Years Old, Female Preventive care refers to lifestyle choices and visits with your health care provider that can promote health and wellness. This includes:  A yearly physical exam. This is also called an annual wellness visit.  Regular dental and eye exams.  Immunizations.  Screening for certain conditions.  Healthy lifestyle choices, such as: ? Eating a healthy diet. ? Getting regular exercise. ? Not using drugs or products that contain nicotine and tobacco. ? Limiting alcohol use. What can I expect for my preventive care visit? Physical exam Your health care provider will check your:  Height and weight. These may be used to calculate your BMI (body mass index). BMI is a measurement  that tells if you are at a healthy weight.  Heart rate and blood pressure.  Body temperature.  Skin for abnormal spots. Counseling Your health care provider may ask you questions about your:  Past medical problems.  Family's medical history.  Alcohol, tobacco, and drug use.  Emotional well-being.  Home life and relationship well-being.  Sexual activity.  Diet, exercise, and sleep habits.  Work and work Statistician.  Access to firearms.  Method of birth control.  Menstrual cycle.  Pregnancy history. What immunizations do I need? Vaccines are usually given at various ages, according to a schedule. Your health care provider will recommend vaccines for you based on your age, medical history, and lifestyle or other factors, such as travel or where you work.   What tests do I need? Blood tests  Lipid and cholesterol levels. These may be checked every 5 years, or more often if you are over 50 years old.  Hepatitis C test.  Hepatitis B  test. Screening  Lung cancer screening. You may have this screening every year starting at age 92 if you have a 30-pack-year history of smoking and currently smoke or have quit within the past 15 years.  Colorectal cancer screening. ? All adults should have this screening starting at age 12 and continuing until age 68. ? Your health care provider may recommend screening at age 81 if you are at increased risk. ? You will have tests every 1-10 years, depending on your results and the type of screening test.  Diabetes screening. ? This is done by checking your blood sugar (glucose) after you have not eaten for a while (fasting). ? You may have this done every 1-3 years.  Mammogram. ? This may be done every 1-2 years. ? Talk with your health care provider about when you should start having regular mammograms. This may depend on whether you have a family history of breast cancer.  BRCA-related cancer screening. This may be done if you  have a family history of breast, ovarian, tubal, or peritoneal cancers.  Pelvic exam and Pap test. ? This may be done every 3 years starting at age 59. ? Starting at age 73, this may be done every 5 years if you have a Pap test in combination with an HPV test. Other tests  STD (sexually transmitted disease) testing, if you are at risk.  Bone density scan. This is done to screen for osteoporosis. You may have this scan if you are at high risk for osteoporosis. Talk with your health care provider about your test results, treatment options, and if necessary, the need for more tests. Follow these instructions at home: Eating and drinking  Eat a diet that includes fresh fruits and vegetables, whole grains, lean protein, and low-fat dairy products.  Take vitamin and mineral supplements as recommended by your health care provider.  Do not drink alcohol if: ? Your health care provider tells you not to drink. ? You are pregnant, may be pregnant, or are planning to become pregnant.  If you drink alcohol: ? Limit how much you have to 0-1 drink a day. ? Be aware of how much alcohol is in your drink. In the [place] one drink equals one 12 oz bottle of beer (355 mL), one 5 oz glass of wine (148 mL), or one 1 oz glass of hard liquor (44 mL).   Lifestyle  Take daily care of your teeth and gums. Brush your teeth every morning and night with fluoride toothpaste. Floss one time each day.  Stay active. Exercise for at least 30 minutes 5 or more days each week.  Do not use any products that contain nicotine or tobacco, such as cigarettes, e-cigarettes, and chewing tobacco. If you need help quitting, ask your health care provider.  Do not use drugs.  If you are sexually active, practice safe sex. Use a condom or other form of protection to prevent STIs (sexually transmitted infections).  If you do not wish to become pregnant, use a form of birth control. If you plan to become pregnant, see your health  care provider for a prepregnancy visit.  If told by your health care provider, take low-dose aspirin daily starting at age 80.  Find healthy ways to cope with stress, such as: ? Meditation, yoga, or listening to music. ? Journaling. ? Talking to a trusted person. ? Spending time with friends and family. Safety  Always wear your seat belt while driving or riding in a vehicle.  Do not drive: ? If you have been drinking alcohol. Do not ride with someone who has been drinking. ? When you are tired or distracted. ? While texting.  Wear a helmet and other protective equipment during sports activities.  If you have firearms in your house, make sure you follow all gun safety procedures. What's next?  Visit your health care provider once a year for an annual wellness visit.  Ask your health care provider how often you should have your eyes and teeth checked.  Stay up to date on all vaccines. This information is not intended to replace advice given to you by your health care provider. Make sure you discuss any questions you have with your health care provider. Document Revised: 08/28/2020 Document Reviewed: 08/05/2018 Elsevier Patient Education  2021 [place].   Diabetes Mellitus Action Plan Following a diabetes action plan is a way for you to manage your diabetes (diabetes mellitus) symptoms. The plan is color-coded to help you understand what actions you need to take based on any symptoms you are having.  If you have symptoms in the red zone, you need medical care right away.  If you have symptoms in the yellow zone, you are having problems.  If you have symptoms in the green zone, you are doing well. Learning about and understanding diabetes can take time. Follow the plan that you develop with your health care provider. Know the target range for your blood sugar (glucose) level, and review your treatment plan with your health care provider at each visit.  The target range for  my blood sugar level when waking in the morning  is 90-120 mg/dL The target range for my blood sugar level 1-2 hours after eating is less than 160 mg/dL  Red zone Get medical help right away if you have any of the following symptoms:  A blood sugar test result that is below 54 mg/dL (3 mmol/L).  A blood sugar test result that is at or above 240 mg/dL (13.3 mmol/L) for 2 days in a row.  Confusion or trouble thinking clearly.  Difficulty breathing.  Sickness or a fever for 2 or more days that is not getting better.  Moderate or large ketone levels in your urine.  Feeling tired or having no energy. If you have any red zone symptoms, do not wait to see if the symptoms will go away. Get medical help right away. Call your local emergency services (911 in the [place] Do not drive yourself to the hospital. If you have severely low blood sugar (severe hypoglycemia) and you cannot eat or drink, you may need glucagon. Make sure a family member or close friend knows how to check your blood sugar and how to give you glucagon. You may need to be treated in a hospital for this condition.   Yellow zone If you have any of the following symptoms, your diabetes is not under control and you may need to make some changes:  A blood sugar test result that is at or above 240 mg/dL (13.3 mmol/L) for 2 days in a row.  Blood sugar test results that are below 70 mg/dL (3.9 mmol/L).  Other symptoms of hypoglycemia, such as: ? Shaking or feeling light-headed. ? Confusion or irritability. ? Feeling hungry. ? Having a fast heartbeat. If you have any yellow zone symptoms:  Treat your hypoglycemia by eating or drinking 15 grams of a rapid-acting carbohydrate. Follow the 15:15 rule: ? Take 15 grams of a rapid-acting carbohydrate, such as:  1 tube of glucose gel.  4 glucose pills.  4 oz (120 mL) of fruit juice.  4 oz (120 mL) of regular (not diet) soda. ? Check your blood sugar 15 minutes after you take the  carbohydrate. ? If the repeat blood sugar test is still at or below 70 mg/dL (3.9 mmol/L), take 15 grams of a carbohydrate again. ? If your blood sugar does not increase above 70 mg/dL (3.9 mmol/L) after 3 tries, get medical help right away. ? After your blood sugar returns to normal, eat a meal or a snack within 1 hour.  Keep taking your daily medicines as told by your health care provider.  Check your blood sugar more often than you normally would. ? Write down your results. ? Call your health care provider if you have trouble keeping your blood sugar in your target range.   Green zone These signs mean you are doing well and you can continue what you are doing to manage your diabetes:  Your blood sugar is within your personal target range. For most people, a blood sugar level before a meal (preprandial) should be 80-130 mg/dL (4.4-7.2 mmol/L).  You feel well, and you are able to do daily activities. If you are in the green zone, continue to manage your diabetes as told by your health care provider. To do this:  Eat a healthy diet.  Exercise regularly.  Check your blood sugar as told by your health care provider.  Take your medicines as told by your health care provider.   Where to find more information  American Diabetes Association (ADA): diabetes.org  Association of Diabetes Care & Education Specialists (ADCES): diabeteseducator.org Summary  Following a diabetes action plan is a way for you to manage your diabetes symptoms. The plan is color-coded to help you understand what actions you need to take based on any symptoms you are having.  Follow the plan that you develop with your health care provider. Make sure you know your personal target blood sugar level.  Review your treatment plan with your health care provider at each visit. This information is not intended to replace advice given to you by your health care provider. Make sure you discuss any questions you have with  your health care provider. Document Revised: 05/31/2020 Document Reviewed: 05/31/2020 Elsevier Patient Education  [place] to help understand and manage your diabetes:  The American Diabetes Association The CDC (search diabetes) [employer] (search diabetes resources)      Why follow it? Research shows. . Those who follow the Mediterranean diet have a reduced risk of heart disease  . The diet is associated with a reduced incidence of Parkinson's and Alzheimer's diseases . People following the diet may have longer life expectancies and lower rates of chronic diseases  . The Dietary Guidelines for Americans recommends the Mediterranean diet as an eating plan to promote health and prevent disease  What Is the Mediterranean Diet?  . Healthy eating plan based on typical foods and recipes of Mediterranean-style cooking . The diet is primarily a plant based diet; these foods should make up a majority of meals   Starches - Plant based foods should make up a majority of meals - They are an important sources of vitamins, minerals, energy, antioxidants, and fiber - Choose whole grains, foods high in fiber and minimally processed items  - Typical grain sources include wheat, oats, barley, corn, brown rice, bulgar, farro, millet, polenta, couscous  - Various types  of beans include chickpeas, lentils, fava beans, black beans, white beans   Fruits  Veggies - Large quantities of antioxidant rich fruits & veggies; 6 or more servings  - Vegetables can be eaten raw or lightly drizzled with oil and cooked  - Vegetables common to the traditional Mediterranean Diet include: artichokes, arugula, beets, broccoli, brussel sprouts, cabbage, carrots, celery, collard greens, cucumbers, eggplant, kale, leeks, lemons, lettuce, mushrooms, okra, onions, peas, peppers, potatoes, pumpkin, radishes, rutabaga, shallots, spinach, sweet potatoes, turnips, zucchini - Fruits common to the  Mediterranean Diet include: apples, apricots, avocados, cherries, clementines, dates, figs, grapefruits, grapes, melons, nectarines, oranges, peaches, pears, pomegranates, strawberries, tangerines  Fats - Replace butter and margarine with healthy oils, such as olive oil, canola oil, and tahini  - Limit nuts to no more than a handful a day  - Nuts include walnuts, almonds, pecans, pistachios, pine nuts  - Limit or avoid candied, honey roasted or heavily salted nuts - Olives are central to the [employer] - can be eaten whole or used in a variety of dishes   Meats Protein - Limiting red meat: no more than a few times a month - When eating red meat: choose lean cuts and keep the portion to the size of deck of cards - Eggs: approx. 0 to 4 times a week  - Fish and lean poultry: at least 2 a week  - Healthy protein sources include, chicken, [place], lean beef, lamb - Increase intake of seafood such as tuna, salmon, trout, mackerel, shrimp, scallops - Avoid or limit high fat processed meats such as sausage and bacon  Dairy - Include moderate amounts of low fat dairy products  - Focus on healthy dairy such as fat free yogurt, skim milk, low or reduced fat cheese - Limit dairy products higher in fat such as whole or 2% milk, cheese, ice cream  Alcohol - Moderate amounts of red wine is ok  - No more than 5 oz daily for women (all ages) and men older than age 22  - No more than 10 oz of wine daily for men younger than 65  Other - Limit sweets and other desserts  - Use herbs and spices instead of salt to flavor foods  - Herbs and spices common to the traditional Mediterranean Diet include: basil, bay leaves, chives, cloves, cumin, fennel, garlic, lavender, marjoram, mint, oregano, parsley, pepper, rosemary, sage, savory, sumac, tarragon, thyme   It's not just a diet, it's a lifestyle:  . The Mediterranean diet includes lifestyle factors typical of those in the region  . Foods, drinks and meals are  best eaten with others and savored . Daily physical activity is important for overall good health . This could be strenuous exercise like running and aerobics . This could also be more leisurely activities such as walking, housework, yard-work, or taking the stairs . Moderation is the key; a balanced and healthy diet accommodates most foods and drinks . Consider portion sizes and frequency of consumption of certain foods   Meal Ideas & Options:  . Breakfast:  o Whole wheat toast or whole wheat English muffins with peanut butter & hard boiled egg o Steel cut oats topped with apples & cinnamon and skim milk  o Fresh fruit: banana, strawberries, melon, berries, peaches  o Smoothies: strawberries, bananas, greek yogurt, peanut butter o Low fat greek yogurt with blueberries and granola  o Egg white omelet with spinach and mushrooms o Breakfast couscous: whole wheat couscous, apricots, skim milk, cranberries  .  Sandwiches:  o Hummus and grilled vegetables (peppers, zucchini, squash) on whole wheat bread   o Grilled chicken on whole wheat pita with lettuce, tomatoes, cucumbers or tzatziki  o Tuna salad on whole wheat bread: tuna salad made with greek yogurt, olives, red peppers, capers, green onions o Garlic rosemary lamb pita: lamb sauted with garlic, rosemary, salt & pepper; add lettuce, cucumber, greek yogurt to pita - flavor with lemon juice and black pepper  . Seafood:  o Mediterranean grilled salmon, seasoned with garlic, basil, parsley, lemon juice and black pepper o Shrimp, lemon, and spinach whole-grain pasta salad made with low fat greek yogurt  o Seared scallops with lemon orzo  o Seared tuna steaks seasoned salt, pepper, coriander topped with tomato mixture of olives, tomatoes, olive oil, minced garlic, parsley, green onions and cappers  . Meats:  o Herbed greek chicken salad with kalamata olives, cucumber, feta  o Red bell peppers stuffed with spinach, bulgur, lean ground beef (or  lentils) & topped with feta   o Kebabs: skewers of chicken, tomatoes, onions, zucchini, squash  o [place] burgers: made with red onions, mint, dill, lemon juice, feta cheese topped with roasted red peppers . Vegetarian o Cucumber salad: cucumbers, artichoke hearts, celery, red onion, feta cheese, tossed in olive oil & lemon juice  o Hummus and whole grain pita points with a greek salad (lettuce, tomato, feta, olives, cucumbers, red onion) o Lentil soup with celery, carrots made with vegetable broth, garlic, salt and pepper  o Tabouli salad: parsley, bulgur, mint, scallions, cucumbers, tomato, radishes, lemon juice, olive oil, salt and pepper.       Fat and Cholesterol Restricted Eating Plan Getting too much fat and cholesterol in your diet may cause health problems. Choosing the right foods helps keep your fat and cholesterol at normal levels. This can keep you from getting certain diseases. Your doctor may recommend an eating plan that includes:  Total fat: ______% or less of total calories a day.  Saturated fat: ______% or less of total calories a day.  Cholesterol: less than _________mg a day.  Fiber: ______g a day. What are tips for following this plan? Meal planning  At meals, divide your plate into four equal parts: ? Fill one-half of your plate with vegetables and green salads. ? Fill one-fourth of your plate with whole grains. ? Fill one-fourth of your plate with low-fat (lean) protein foods.  Eat fish that is high in omega-3 fats at least two times a week. This includes mackerel, tuna, sardines, and salmon.  Eat foods that are high in fiber, such as whole grains, beans, apples, broccoli, carrots, peas, and barley. General tips  Work with your doctor to lose weight if you need to.  Avoid: ? Foods with added sugar. ? Fried foods. ? Foods with partially hydrogenated oils.  Limit alcohol intake to no more than 1 drink a day for nonpregnant women and 2 drinks a day for  men. One drink equals 12 oz of beer, 5 oz of wine, or 1 oz of hard liquor.   Reading food labels  Check food labels for: ? Trans fats. ? Partially hydrogenated oils. ? Saturated fat (g) in each serving. ? Cholesterol (mg) in each serving. ? Fiber (g) in each serving.  Choose foods with healthy fats, such as: ? Monounsaturated fats. ? Polyunsaturated fats. ? Omega-3 fats.  Choose grain products that have whole grains. Look for the word "whole" as the first word in the ingredient list. Cooking  Cook foods using low-fat methods. These include baking, boiling, grilling, and broiling.  Eat more home-cooked foods. Eat at restaurants and buffets less often.  Avoid cooking using saturated fats, such as butter, cream, palm oil, palm kernel oil, and coconut oil. Recommended foods Fruits  All fresh, canned (in natural juice), or frozen fruits. Vegetables  Fresh or frozen vegetables (raw, steamed, roasted, or grilled). Green salads. Grains  Whole grains, such as whole wheat or whole grain breads, crackers, cereals, and pasta. Unsweetened oatmeal, bulgur, barley, quinoa, or brown rice. Corn or whole wheat flour tortillas. Meats and other protein foods  Ground beef (85% or leaner), grass-fed beef, or beef trimmed of fat. Skinless chicken or [place]. Ground chicken or [place]. Pork trimmed of fat. All fish and seafood. Egg whites. Dried beans, peas, or lentils. Unsalted nuts or seeds. Unsalted canned beans. Nut butters without added sugar or oil. Dairy  Low-fat or nonfat dairy products, such as skim or 1% milk, 2% or reduced-fat cheeses, low-fat and fat-free ricotta or cottage cheese, or plain low-fat and nonfat yogurt. Fats and oils  Tub margarine without trans fats. Light or reduced-fat mayonnaise and salad dressings. Avocado. Olive, canola, sesame, or safflower oils. The items listed above may not be a complete list of foods and beverages you can eat. Contact a dietitian for more  information.   Foods to avoid Fruits  Canned fruit in heavy syrup. Fruit in cream or butter sauce. Fried fruit. Vegetables  Vegetables cooked in cheese, cream, or butter sauce. Fried vegetables. Grains  White bread. White pasta. White rice. Cornbread. Bagels, pastries, and croissants. Crackers and snack foods that contain trans fat and hydrogenated oils. Meats and other protein foods  Fatty cuts of meat. Ribs, chicken wings, bacon, sausage, bologna, salami, chitterlings, fatback, hot dogs, bratwurst, and packaged lunch meats. Liver and organ meats. Whole eggs and egg yolks. Chicken and [place] with skin. Fried meat. Dairy  Whole or 2% milk, cream, half-and-half, and cream cheese. Whole milk cheeses. Whole-fat or sweetened yogurt. Full-fat cheeses. Nondairy creamers and whipped toppings. Processed cheese, cheese spreads, and cheese curds. Beverages  Alcohol. Sugar-sweetened drinks such as sodas, lemonade, and fruit drinks. Fats and oils  Butter, stick margarine, lard, shortening, ghee, or bacon fat. Coconut, palm kernel, and palm oils. Sweets and desserts  Corn syrup, sugars, honey, and molasses. Candy. Jam and jelly. Syrup. Sweetened cereals. Cookies, pies, cakes, donuts, muffins, and ice cream. The items listed above may not be a complete list of foods and beverages you should avoid. Contact a dietitian for more information. Summary  Choosing the right foods helps keep your fat and cholesterol at normal levels. This can keep you from getting certain diseases.  At meals, fill one-half of your plate with vegetables and green salads.  Eat high-fiber foods, like whole grains, beans, apples, carrots, peas, and barley.  Limit added sugar, saturated fats, alcohol, and fried foods. This information is not intended to replace advice given to you by your health care provider. Make sure you discuss any questions you have with your health care provider. Document Revised: 03/28/2020 Document  Reviewed: 03/28/2020 Elsevier Patient Education  2021 [employer].

## 2021-03-11 NOTE — Assessment & Plan Note (Signed)
Patient reported insomnia has improved with use of hydroxyzine.  She does report that occasionally she will wake up with a headache after using the hydroxyzine.  Discussed with the patient that she can cut the dose in half to see if this helps her sleep and reduces the headaches present. Follow-up if symptoms worsen or fail to improve.

## 2021-03-11 NOTE — Assessment & Plan Note (Signed)
BMI has improved since last office visit. With new diagnosis of diabetes and hyperlipidemia we discussed at length diet, exercise, and lifestyle changes to help to lower her risks of cardiovascular disease and chronic long-term complications from her chronic illnesses. Strongly encourage beginning activity of at least 20 minutes a day walking and increasing as necessary. We will also plan to start Metformin today for diabetes and I am hopeful that this will help with her weight management as well.  She is also starting to use meal preparation plan once again that has been effective for her in the past and will likely also help with her diabetes. If dietary and lifestyle changes are not effective within the next 6 months then we can consider a GLP-1 addition to her Metformin to help with weight management as well. Follow-up in approximately 3 months.

## 2021-03-11 NOTE — Assessment & Plan Note (Signed)
Considering new diagnosis of diabetes some of the incontinence that she has been experiencing may be due to elevated blood glucose readings and increased urination. She has had a referral for pelvic floor PT and we will also start estradiol cream for vaginal atrophy. Recommend follow-up if symptoms worsen or treatments are not effective.

## 2021-03-12 LAB — CYTOLOGY - PAP
Comment: NEGATIVE
Diagnosis: NEGATIVE
High risk HPV: NEGATIVE

## 2021-03-12 NOTE — Progress Notes (Signed)
Pap results normal. Repeat pap in 5 years.

## 2021-03-13 ENCOUNTER — Telehealth (HOSPITAL_BASED_OUTPATIENT_CLINIC_OR_DEPARTMENT_OTHER): Payer: Self-pay

## 2021-03-13 NOTE — Telephone Encounter (Signed)
-----   Message from Orma Render, NP sent at 03/12/2021  5:40 PM EDT ----- Pap results normal. Repeat pap in 5 years.

## 2021-03-13 NOTE — Telephone Encounter (Signed)
Results released by Sarabeth Early, AGNP and reviewed by patient via MyChart. Instructed patient to contact the office with any questions or concerns.  

## 2021-05-08 ENCOUNTER — Other Ambulatory Visit (HOSPITAL_BASED_OUTPATIENT_CLINIC_OR_DEPARTMENT_OTHER): Payer: Self-pay | Admitting: Nurse Practitioner

## 2021-05-08 ENCOUNTER — Encounter (HOSPITAL_BASED_OUTPATIENT_CLINIC_OR_DEPARTMENT_OTHER): Payer: Self-pay | Admitting: Nurse Practitioner

## 2021-05-08 DIAGNOSIS — F419 Anxiety disorder, unspecified: Secondary | ICD-10-CM

## 2021-05-08 DIAGNOSIS — F32A Depression, unspecified: Secondary | ICD-10-CM

## 2021-05-08 MED ORDER — FLUOXETINE HCL 20 MG PO CAPS: ORAL_CAPSULE | ORAL | 2 refills | Status: DC

## 2021-05-30 ENCOUNTER — Other Ambulatory Visit (HOSPITAL_BASED_OUTPATIENT_CLINIC_OR_DEPARTMENT_OTHER): Payer: Self-pay | Admitting: Nurse Practitioner

## 2021-05-30 DIAGNOSIS — F32A Depression, unspecified: Secondary | ICD-10-CM

## 2021-05-30 DIAGNOSIS — F419 Anxiety disorder, unspecified: Secondary | ICD-10-CM

## 2021-05-30 MED ORDER — FLUOXETINE HCL 20 MG PO CAPS: ORAL_CAPSULE | ORAL | 2 refills | Status: DC

## 2021-05-31 ENCOUNTER — Ambulatory Visit
Admission: RE | Admit: 2021-05-31 | Discharge: 2021-05-31 | Disposition: A | Discharge status: Home / Self Care | Payer: 59 | Source: Ambulatory Visit | Attending: Nurse Practitioner | Admitting: Nurse Practitioner

## 2021-05-31 ENCOUNTER — Other Ambulatory Visit: Payer: Self-pay

## 2021-05-31 DIAGNOSIS — Z0000 Encounter for general adult medical examination without abnormal findings: Secondary | ICD-10-CM

## 2021-05-31 DIAGNOSIS — Z01419 Encounter for gynecological examination (general) (routine) without abnormal findings: Secondary | ICD-10-CM

## 2021-06-03 ENCOUNTER — Other Ambulatory Visit: Payer: Self-pay | Admitting: Nurse Practitioner

## 2021-06-03 DIAGNOSIS — N644 Mastodynia: Secondary | ICD-10-CM

## 2021-06-07 ENCOUNTER — Ambulatory Visit: Payer: 59

## 2021-06-07 ENCOUNTER — Ambulatory Visit
Admission: RE | Admit: 2021-06-07 | Discharge: 2021-06-07 | Disposition: A | Discharge status: Home / Self Care | Payer: 59 | Source: Ambulatory Visit | Attending: Nurse Practitioner | Admitting: Nurse Practitioner

## 2021-06-07 ENCOUNTER — Other Ambulatory Visit: Payer: Self-pay

## 2021-06-07 DIAGNOSIS — N644 Mastodynia: Secondary | ICD-10-CM

## 2021-06-11 ENCOUNTER — Other Ambulatory Visit (HOSPITAL_BASED_OUTPATIENT_CLINIC_OR_DEPARTMENT_OTHER): Payer: Self-pay

## 2021-06-11 ENCOUNTER — Encounter (HOSPITAL_BASED_OUTPATIENT_CLINIC_OR_DEPARTMENT_OTHER): Payer: Self-pay | Admitting: Nurse Practitioner

## 2021-06-11 ENCOUNTER — Other Ambulatory Visit: Payer: Self-pay

## 2021-06-11 ENCOUNTER — Ambulatory Visit (INDEPENDENT_AMBULATORY_CARE_PROVIDER_SITE_OTHER): Payer: 59 | Admitting: Nurse Practitioner

## 2021-06-11 VITALS — BP 109/63 | HR 80 | Ht 62.0 in | Wt 180.0 lb

## 2021-06-11 DIAGNOSIS — F32A Depression, unspecified: Secondary | ICD-10-CM

## 2021-06-11 DIAGNOSIS — N951 Menopausal and female climacteric states: Secondary | ICD-10-CM | POA: Diagnosis not present

## 2021-06-11 DIAGNOSIS — E1165 Type 2 diabetes mellitus with hyperglycemia: Secondary | ICD-10-CM

## 2021-06-11 DIAGNOSIS — E782 Mixed hyperlipidemia: Secondary | ICD-10-CM | POA: Diagnosis not present

## 2021-06-11 DIAGNOSIS — Z6832 Body mass index (BMI) 32.0-32.9, adult: Secondary | ICD-10-CM

## 2021-06-11 DIAGNOSIS — F419 Anxiety disorder, unspecified: Secondary | ICD-10-CM

## 2021-06-11 MED ORDER — ESTROGENS, CONJUGATED 0.625 MG/GM VA CREA
TOPICAL_CREAM | VAGINAL | 0 refills | Status: DC
  Filled 2021-06-11: qty 30, 30d supply, fill #0

## 2021-06-11 MED ORDER — PREMARIN 0.625 MG/GM VA CREA
1.0000 | TOPICAL_CREAM | Freq: Every day | VAGINAL | 12 refills | Status: DC
  Filled 2021-06-11: qty 30, 15d supply, fill #0
  Filled 2021-09-24: qty 30, 15d supply, fill #1
  Filled 2021-12-20: qty 30, 15d supply, fill #2

## 2021-06-11 NOTE — Patient Instructions (Addendum)
You are doing AMAZING!!!!! I am so proud of you, I can tell you are working hard!!!  We will check your A1c today and we will decide if we want to stick with the current medication or make some changes.

## 2021-06-11 NOTE — Progress Notes (Signed)
Mammogram negative! Will plan for one year repeat for screening.

## 2021-06-11 NOTE — Progress Notes (Signed)
Established Patient Office Visit  Subjective:  Patient ID: Haley Charles, female    DOB: 1971/05/03  Age: 50 y.o. MRN: [id number]  CC:  Chief Complaint  Patient presents with   Follow-up    Follow up with DM.    HPI BOOTS MCGLOWN presents for DM, HLD, Mood  DIABETES Taking medications as prescribed: yes- Metformin 1044m BID Taking Insulin?: no Glucose Monitoring: yes  Accucheck frequency:  daily  Running between 120's - 160's Hypoglycemic episodes:no Polydipsia/polyuria: no- urination has decreased alot Visual changes: no Chest pain: no Paresthesias: no  HYPERLIPIDEMIA Hyperlipidemia status:  no medication at this time Supplements: none Aspirin:  no The 10-year ASCVD risk score (Mikey BussingDC Jr., et al., 2013) is: 3.9%   Values used to calculate the score:     Age: [age]years     Sex: Female     Is Non-Hispanic African American: No     Diabetic: Yes     Tobacco smoker: No     Systolic Blood Pressure: 1440mmHg     Is BP treated: No     HDL Cholesterol: 35 mg/dL     Total Cholesterol: 248 mg/dL Chest pain:  no Coronary artery disease:  unknown Family history CAD:  yes Family history Jariana Shumard CAD:  no  SMennenreports her moos is much improved with use or fluoxetine 230mper day. She has no side effects of the medication. She is sleeping well and activity levels are WNL. No SI/HI  Outpatient Medications Prior to Visit  Medication Sig Dispense Refill   blood glucose meter kit and supplies KIT Testing every morning before eating or drinking and after each meal, up to three additional times a day as needed. 1 each 99   FLUoxetine (PROZAC) 20 MG capsule Take one tablet (2087mby mouth every morning. 90 capsule 2   hydrOXYzine (ATARAX/VISTARIL) 50 MG tablet Take 1 tablet (50 mg total) by mouth at bedtime and may repeat dose one time if needed. For sleep. 30 tablet 3   metFORMIN (GLUCOPHAGE) 1000 MG tablet Take 1 tablet (1,000 mg total) by mouth 2 (two) times daily with a meal. 180  tablet 3   conjugated estrogens (PREMARIN) vaginal cream Apply pea sized to quarter sized amount to vaginal opening and right inside vagina daily. Start with smallest amount and increase if needed for symptoms. 30 g 3   estradiol (ESTRACE VAGINAL) 0.1 MG/GM vaginal cream Place pea sized amount to vagina at bedtime daily. 42.5 g 12   No facility-administered medications prior to visit.    Allergies  Allergen Reactions   Latex Rash    ROS Review of Systems All review of systems negative except what is listed in the HPI    Objective:    Physical Exam Vitals and nursing note reviewed.  Constitutional:      Appearance: Normal appearance.  HENT:     Head: Normocephalic and atraumatic.  Eyes:     Extraocular Movements: Extraocular movements intact.     Conjunctiva/sclera: Conjunctivae normal.     Pupils: Pupils are equal, round, and reactive to light.  Neck:     Vascular: No carotid bruit.  Cardiovascular:     Rate and Rhythm: Normal rate and regular rhythm.     Pulses: Normal pulses.     Heart sounds: Normal heart sounds.  Pulmonary:     Effort: Pulmonary effort is normal.     Breath sounds: Normal breath sounds.  Abdominal:     General:  Abdomen is flat. Bowel sounds are normal. There is no distension.     Palpations: Abdomen is soft.     Tenderness: There is no abdominal tenderness. There is no right CVA tenderness, left CVA tenderness, guarding or rebound.  Musculoskeletal:        General: Normal range of motion.     Cervical back: Normal range of motion and neck supple.     Right lower leg: No edema.     Left lower leg: No edema.  Skin:    General: Skin is warm and dry.     Capillary Refill: Capillary refill takes less than 2 seconds.  Neurological:     General: No focal deficit present.     Mental Status: She is alert and oriented to person, place, and time.  Psychiatric:        Mood and Affect: Mood normal.        Behavior: Behavior normal.        Thought  Content: Thought content normal.        Judgment: Judgment normal.    BP 109/63   Pulse 80   Ht 5' 2"  (1.575 m)   Wt 180 lb (81.6 kg)   SpO2 100%   BMI 32.92 kg/m  Wt Readings from Last 3 Encounters:  06/11/21 180 lb (81.6 kg)  03/11/21 191 lb 3.2 oz (86.7 kg)  03/04/21 192 lb 12.8 oz (87.5 kg)     Health Maintenance Due  Topic Date Due   PNEUMOCOCCAL POLYSACCHARIDE VACCINE AGE 42-64 HIGH RISK  Never done   COVID-19 Vaccine (1) Never done   OPHTHALMOLOGY EXAM  Never done   URINE MICROALBUMIN  Never done   TETANUS/TDAP  Never done   HEMOGLOBIN A1C  06/07/2021    There are no preventive care reminders to display for this patient.  Lab Results  Component Value Date   TSH 1.410 03/08/2021   Lab Results  Component Value Date   WBC 7.7 03/08/2021   HGB 14.2 03/08/2021   HCT 40.0 03/08/2021   MCV 84.9 03/08/2021   PLT 276 03/08/2021   Lab Results  Component Value Date   NA 136 03/08/2021   K 3.8 03/08/2021   CO2 23 03/08/2021   GLUCOSE 256 (H) 03/08/2021   BUN 13 03/08/2021   CREATININE 0.62 03/08/2021   BILITOT 0.5 03/08/2021   ALKPHOS 132 (H) 03/08/2021   AST 27 03/08/2021   ALT 34 03/08/2021   PROT 7.3 03/08/2021   ALBUMIN 4.3 03/08/2021   CALCIUM 9.1 03/08/2021   ANIONGAP 10 03/08/2021   GFR 95.76 04/15/2018   Lab Results  Component Value Date   CHOL 248 (H) 06/11/2021   Lab Results  Component Value Date   HDL 35 (L) 06/11/2021   Lab Results  Component Value Date   LDLCALC 127 (H) 06/11/2021   Lab Results  Component Value Date   TRIG 476 (H) 06/11/2021   Lab Results  Component Value Date   CHOLHDL 7.1 (H) 06/11/2021   Lab Results  Component Value Date   HGBA1C 9.6 (H) 03/08/2021      Assessment & Plan:   Problem List Items Addressed This Visit     Anxiety and depression    Mood doing well with fluoxetine 27m per day and hydroxyzine for sleep.  No changes to plan of care today.  F/U in 6 months.        Vaginal dryness,  menopausal    Ineffective treatment with estradiol cream for  vaginal dryness.  Reorder premarin cream for treatment and sent to [facility] with coupon. F/U if symptoms worsen or fail to improve with current treatment.        Relevant Medications   conjugated estrogens (PREMARIN) vaginal cream   BMI 32.0-32.9,adult    BMI 32.92 today- significant improvement from last visit.  Recommend continued diet and exercise modifications to help with additional weight loss.  Will obtain A1c today and determine if adding GLP-1 may be helpful for improved glycemic control and weight management as well as CV health.  Coupon provided today- if needed, recommend Ozempic weekly.  F/U 6 months       Mixed hyperlipidemia    Recent start on metformin for glycemic control.  Will recheck lipids today given that diet and activity routines have changed to see if we have any improved control over lipids.  Atherosclerosis detected on CT in 1999.  PLAN: Consider start of low dose statin for maintenance if lipids have stabilized. Consider high dose statin if stabilization has not occurred.  Consider GLP-1 for improved glycemic control and CV protection.  F/U in 6 months.        Relevant Orders   Lipid panel (Completed)   Type 2 diabetes mellitus with hyperglycemia, without long-term current use of insulin (HCC) - Primary   Relevant Orders   POCT glycosylated hemoglobin (Hb A1C)    Meds ordered this encounter  Medications   conjugated estrogens (PREMARIN) vaginal cream    Sig: Place 1 Applicatorful vaginally daily.    Dispense:  42.5 g    Refill:  12    Follow-up: Return in about 6 months (around 12/12/2021) for DM.    Orma Render, NP

## 2021-06-12 ENCOUNTER — Other Ambulatory Visit (HOSPITAL_BASED_OUTPATIENT_CLINIC_OR_DEPARTMENT_OTHER): Payer: Self-pay | Admitting: Nurse Practitioner

## 2021-06-12 ENCOUNTER — Encounter (HOSPITAL_BASED_OUTPATIENT_CLINIC_OR_DEPARTMENT_OTHER): Payer: Self-pay | Admitting: Nurse Practitioner

## 2021-06-12 DIAGNOSIS — E1165 Type 2 diabetes mellitus with hyperglycemia: Secondary | ICD-10-CM

## 2021-06-12 DIAGNOSIS — E1169 Type 2 diabetes mellitus with other specified complication: Secondary | ICD-10-CM

## 2021-06-12 LAB — LIPID PANEL
Chol/HDL Ratio: 7.1 ratio — ABNORMAL HIGH (ref 0.0–4.4)
Cholesterol, Total: 248 mg/dL — ABNORMAL HIGH (ref 100–199)
HDL: 35 mg/dL — ABNORMAL LOW (ref >39)
LDL Chol Calc (NIH): 127 mg/dL — ABNORMAL HIGH (ref 0–99)
Triglycerides: 476 mg/dL — ABNORMAL HIGH (ref 0–149)
VLDL Cholesterol Cal: 86 mg/dL — ABNORMAL HIGH (ref 5–40)

## 2021-06-12 MED ORDER — ROSUVASTATIN CALCIUM 20 MG PO TABS: ORAL_TABLET | ORAL | 3 refills | Status: DC

## 2021-06-12 MED ORDER — OZEMPIC (0.25 OR 0.5 MG/DOSE) 2 MG/1.5ML ~~LOC~~ SOPN
PEN_INJECTOR | SUBCUTANEOUS | 0 refills | Status: DC
  Filled 2021-06-14: qty 1.5, 30d supply, fill #0
  Filled 2021-07-22: qty 1.5, 30d supply, fill #1
  Filled 2021-08-24: qty 1.5, 28d supply, fill #2

## 2021-06-12 MED ORDER — ONDANSETRON HCL 4 MG PO TABS: 4.0000 mg | ORAL_TABLET | Freq: Three times a day (TID) | ORAL | 3 refills | Status: DC | PRN

## 2021-06-12 NOTE — Assessment & Plan Note (Signed)
Mood doing well with fluoxetine 20mg  per day and hydroxyzine for sleep.  No changes to plan of care today.  F/U in 6 months.

## 2021-06-12 NOTE — Assessment & Plan Note (Addendum)
BMI 32.92 today- significant improvement from last visit.  Recommend continued diet and exercise modifications to help with additional weight loss.  Will obtain A1c today and determine if adding GLP-1 may be helpful for improved glycemic control and weight management as well as CV health.  Coupon provided today- if needed, recommend Ozempic weekly.  F/U 6 months

## 2021-06-12 NOTE — Assessment & Plan Note (Addendum)
Recent start on metformin for glycemic control.  Will recheck lipids today given that diet and activity routines have changed to see if we have any improved control over lipids.  Atherosclerosis detected on CT in 1999.  PLAN: Consider start of low dose statin for maintenance if lipids have stabilized. Consider high dose statin if stabilization has not occurred.  Consider GLP-1 for improved glycemic control and CV protection.  F/U in 6 months.

## 2021-06-12 NOTE — Progress Notes (Signed)
Per provider adding A1C

## 2021-06-12 NOTE — Assessment & Plan Note (Signed)
Ineffective treatment with estradiol cream for vaginal dryness.  Reorder premarin cream for treatment and sent to [facility] with coupon. F/U if symptoms worsen or fail to improve with current treatment.

## 2021-06-13 NOTE — Progress Notes (Signed)
Lipids levels significantly elevated with triglycerides at 476.  At this point her cholesterol ratio shows a 2 times the average risk of cardiovascular disease. In the setting of diabetes recommend aggressive treatment with rosuvastatin 20 mg/day. Medication has been sent and patient has been informed. We will plan to recheck labs in 3 months.

## 2021-06-14 ENCOUNTER — Other Ambulatory Visit (HOSPITAL_BASED_OUTPATIENT_CLINIC_OR_DEPARTMENT_OTHER): Payer: Self-pay

## 2021-07-22 ENCOUNTER — Other Ambulatory Visit (HOSPITAL_BASED_OUTPATIENT_CLINIC_OR_DEPARTMENT_OTHER): Payer: Self-pay

## 2021-07-26 ENCOUNTER — Other Ambulatory Visit (HOSPITAL_BASED_OUTPATIENT_CLINIC_OR_DEPARTMENT_OTHER): Payer: Self-pay

## 2021-08-07 ENCOUNTER — Other Ambulatory Visit (HOSPITAL_BASED_OUTPATIENT_CLINIC_OR_DEPARTMENT_OTHER): Payer: Self-pay | Admitting: Nurse Practitioner

## 2021-08-07 DIAGNOSIS — F32A Depression, unspecified: Secondary | ICD-10-CM

## 2021-08-07 DIAGNOSIS — F5105 Insomnia due to other mental disorder: Secondary | ICD-10-CM

## 2021-08-07 DIAGNOSIS — F409 Phobic anxiety disorder, unspecified: Secondary | ICD-10-CM

## 2021-08-07 DIAGNOSIS — F419 Anxiety disorder, unspecified: Secondary | ICD-10-CM

## 2021-08-08 ENCOUNTER — Encounter (HOSPITAL_BASED_OUTPATIENT_CLINIC_OR_DEPARTMENT_OTHER): Payer: Self-pay

## 2021-08-26 ENCOUNTER — Encounter (HOSPITAL_BASED_OUTPATIENT_CLINIC_OR_DEPARTMENT_OTHER): Payer: Self-pay | Admitting: Nurse Practitioner

## 2021-08-26 ENCOUNTER — Other Ambulatory Visit (HOSPITAL_BASED_OUTPATIENT_CLINIC_OR_DEPARTMENT_OTHER): Payer: Self-pay

## 2021-08-29 ENCOUNTER — Other Ambulatory Visit (HOSPITAL_BASED_OUTPATIENT_CLINIC_OR_DEPARTMENT_OTHER): Payer: Self-pay

## 2021-09-05 ENCOUNTER — Other Ambulatory Visit (HOSPITAL_BASED_OUTPATIENT_CLINIC_OR_DEPARTMENT_OTHER): Payer: Self-pay | Admitting: Nurse Practitioner

## 2021-09-05 DIAGNOSIS — F409 Phobic anxiety disorder, unspecified: Secondary | ICD-10-CM

## 2021-09-05 DIAGNOSIS — F5105 Insomnia due to other mental disorder: Secondary | ICD-10-CM

## 2021-09-05 DIAGNOSIS — F32A Depression, unspecified: Secondary | ICD-10-CM

## 2021-09-05 MED ORDER — HYDROXYZINE HCL 50 MG PO TABS: 50.0000 mg | ORAL_TABLET | Freq: Every evening | ORAL | 2 refills | Status: DC | PRN

## 2021-09-23 ENCOUNTER — Encounter (HOSPITAL_BASED_OUTPATIENT_CLINIC_OR_DEPARTMENT_OTHER): Payer: Self-pay | Admitting: Nurse Practitioner

## 2021-09-24 ENCOUNTER — Other Ambulatory Visit (HOSPITAL_BASED_OUTPATIENT_CLINIC_OR_DEPARTMENT_OTHER): Payer: Self-pay

## 2021-09-24 ENCOUNTER — Other Ambulatory Visit (HOSPITAL_BASED_OUTPATIENT_CLINIC_OR_DEPARTMENT_OTHER): Payer: Self-pay | Admitting: Nurse Practitioner

## 2021-09-24 DIAGNOSIS — E1169 Type 2 diabetes mellitus with other specified complication: Secondary | ICD-10-CM

## 2021-09-24 DIAGNOSIS — E1165 Type 2 diabetes mellitus with hyperglycemia: Secondary | ICD-10-CM

## 2021-09-24 DIAGNOSIS — E782 Mixed hyperlipidemia: Secondary | ICD-10-CM

## 2021-09-25 ENCOUNTER — Other Ambulatory Visit (HOSPITAL_BASED_OUTPATIENT_CLINIC_OR_DEPARTMENT_OTHER): Payer: Self-pay

## 2021-09-25 ENCOUNTER — Other Ambulatory Visit (HOSPITAL_BASED_OUTPATIENT_CLINIC_OR_DEPARTMENT_OTHER): Payer: Self-pay | Admitting: Nurse Practitioner

## 2021-09-25 DIAGNOSIS — E782 Mixed hyperlipidemia: Secondary | ICD-10-CM

## 2021-09-25 DIAGNOSIS — E1169 Type 2 diabetes mellitus with other specified complication: Secondary | ICD-10-CM

## 2021-09-25 DIAGNOSIS — E1165 Type 2 diabetes mellitus with hyperglycemia: Secondary | ICD-10-CM

## 2021-09-26 ENCOUNTER — Other Ambulatory Visit (HOSPITAL_BASED_OUTPATIENT_CLINIC_OR_DEPARTMENT_OTHER): Payer: Self-pay

## 2021-09-27 ENCOUNTER — Other Ambulatory Visit (HOSPITAL_BASED_OUTPATIENT_CLINIC_OR_DEPARTMENT_OTHER): Payer: Self-pay | Admitting: Nurse Practitioner

## 2021-09-27 ENCOUNTER — Other Ambulatory Visit (HOSPITAL_BASED_OUTPATIENT_CLINIC_OR_DEPARTMENT_OTHER): Payer: Self-pay

## 2021-09-27 MED FILL — Semaglutide Soln Pen-inj 0.25 or 0.5 MG/DOSE (2 MG/1.5ML): SUBCUTANEOUS | 30 days supply | Qty: 1.5 | Fill #0 | Status: AC

## 2021-09-27 NOTE — Telephone Encounter (Signed)
Patient's medication has been approved and can be picked up from the pharmacy.

## 2021-09-27 NOTE — Telephone Encounter (Signed)
Pt came up to office stating all her other medication were sent into the pharmacy but her Semaglutide,0.25 or 0.5MG /DOS, (OZEMPIC, 0.25 OR 0.5 MG/DOSE,) 2 MG/1.5ML SOPN [[id number]]   Pt would like this sent in today (10/21) due to the pharmacy being closed tomorrow and she will run out.  Please advise.

## 2021-10-07 ENCOUNTER — Other Ambulatory Visit (HOSPITAL_BASED_OUTPATIENT_CLINIC_OR_DEPARTMENT_OTHER): Payer: Self-pay

## 2021-10-07 DIAGNOSIS — F409 Phobic anxiety disorder, unspecified: Secondary | ICD-10-CM

## 2021-10-07 DIAGNOSIS — F5105 Insomnia due to other mental disorder: Secondary | ICD-10-CM

## 2021-10-07 DIAGNOSIS — F419 Anxiety disorder, unspecified: Secondary | ICD-10-CM

## 2021-10-07 DIAGNOSIS — F32A Depression, unspecified: Secondary | ICD-10-CM

## 2021-10-07 MED ORDER — HYDROXYZINE HCL 50 MG PO TABS
50.0000 mg | ORAL_TABLET | Freq: Every evening | ORAL | 2 refills | Status: DC | PRN
Start: 2021-10-07 — End: 2021-10-10

## 2021-10-10 ENCOUNTER — Other Ambulatory Visit (HOSPITAL_BASED_OUTPATIENT_CLINIC_OR_DEPARTMENT_OTHER): Payer: Self-pay

## 2021-10-10 DIAGNOSIS — F32A Depression, unspecified: Secondary | ICD-10-CM

## 2021-10-10 DIAGNOSIS — F409 Phobic anxiety disorder, unspecified: Secondary | ICD-10-CM

## 2021-10-10 MED ORDER — HYDROXYZINE HCL 50 MG PO TABS: 50.0000 mg | ORAL_TABLET | Freq: Every evening | ORAL | 1 refills | Status: DC | PRN

## 2021-10-25 ENCOUNTER — Other Ambulatory Visit (HOSPITAL_BASED_OUTPATIENT_CLINIC_OR_DEPARTMENT_OTHER): Payer: Self-pay

## 2021-10-25 MED FILL — Semaglutide Soln Pen-inj 0.25 or 0.5 MG/DOSE (2 MG/1.5ML): SUBCUTANEOUS | 30 days supply | Qty: 1.5 | Fill #1 | Status: AC

## 2021-11-17 MED FILL — Semaglutide Soln Pen-inj 0.25 or 0.5 MG/DOSE (2 MG/1.5ML): SUBCUTANEOUS | 28 days supply | Qty: 1.5 | Fill #2 | Status: AC

## 2021-11-18 ENCOUNTER — Other Ambulatory Visit (HOSPITAL_BASED_OUTPATIENT_CLINIC_OR_DEPARTMENT_OTHER): Payer: Self-pay

## 2021-11-22 ENCOUNTER — Encounter (HOSPITAL_BASED_OUTPATIENT_CLINIC_OR_DEPARTMENT_OTHER): Payer: Self-pay | Admitting: Nurse Practitioner

## 2021-12-12 ENCOUNTER — Ambulatory Visit (HOSPITAL_BASED_OUTPATIENT_CLINIC_OR_DEPARTMENT_OTHER): Payer: 59 | Admitting: Nurse Practitioner

## 2021-12-17 ENCOUNTER — Encounter (HOSPITAL_BASED_OUTPATIENT_CLINIC_OR_DEPARTMENT_OTHER): Payer: Self-pay | Admitting: Nurse Practitioner

## 2021-12-17 ENCOUNTER — Ambulatory Visit (HOSPITAL_BASED_OUTPATIENT_CLINIC_OR_DEPARTMENT_OTHER): Payer: 59 | Admitting: Nurse Practitioner

## 2021-12-17 MED FILL — Semaglutide Soln Pen-inj 0.25 or 0.5 MG/DOSE (2 MG/1.5ML): SUBCUTANEOUS | 28 days supply | Qty: 1.5 | Fill #3 | Status: AC

## 2021-12-18 ENCOUNTER — Other Ambulatory Visit (HOSPITAL_BASED_OUTPATIENT_CLINIC_OR_DEPARTMENT_OTHER): Payer: Self-pay

## 2021-12-20 ENCOUNTER — Other Ambulatory Visit (HOSPITAL_BASED_OUTPATIENT_CLINIC_OR_DEPARTMENT_OTHER): Payer: Self-pay

## 2021-12-24 ENCOUNTER — Encounter (HOSPITAL_BASED_OUTPATIENT_CLINIC_OR_DEPARTMENT_OTHER): Payer: Self-pay | Admitting: Nurse Practitioner

## 2021-12-24 ENCOUNTER — Other Ambulatory Visit: Payer: Self-pay

## 2021-12-24 ENCOUNTER — Ambulatory Visit (INDEPENDENT_AMBULATORY_CARE_PROVIDER_SITE_OTHER): Payer: 59 | Admitting: Nurse Practitioner

## 2021-12-24 VITALS — BP 130/68 | HR 93 | Ht 62.0 in | Wt 169.3 lb

## 2021-12-24 DIAGNOSIS — R635 Abnormal weight gain: Secondary | ICD-10-CM

## 2021-12-24 DIAGNOSIS — Z6832 Body mass index (BMI) 32.0-32.9, adult: Secondary | ICD-10-CM

## 2021-12-24 DIAGNOSIS — E782 Mixed hyperlipidemia: Secondary | ICD-10-CM | POA: Diagnosis not present

## 2021-12-24 DIAGNOSIS — M25562 Pain in left knee: Secondary | ICD-10-CM

## 2021-12-24 DIAGNOSIS — E1169 Type 2 diabetes mellitus with other specified complication: Secondary | ICD-10-CM

## 2021-12-24 DIAGNOSIS — Z6830 Body mass index (BMI) 30.0-30.9, adult: Secondary | ICD-10-CM

## 2021-12-24 DIAGNOSIS — E1165 Type 2 diabetes mellitus with hyperglycemia: Secondary | ICD-10-CM | POA: Diagnosis not present

## 2021-12-24 DIAGNOSIS — M25561 Pain in right knee: Secondary | ICD-10-CM | POA: Diagnosis not present

## 2021-12-24 MED ORDER — SEMAGLUTIDE (1 MG/DOSE) 4 MG/3ML ~~LOC~~ SOPN: 1.0000 mg | PEN_INJECTOR | SUBCUTANEOUS | 1 refills | Status: DC

## 2021-12-24 MED ORDER — MELOXICAM 15 MG PO TABS: 15.0000 mg | ORAL_TABLET | Freq: Every day | ORAL | 11 refills | Status: DC

## 2021-12-24 NOTE — Patient Instructions (Addendum)
We will check some labs today and see how things are looking.  I have sent the new Ozempic dosage to the [facility]!!!! I AM SO PROUD OF YOU!!  Let me know if the joint pain doesn't get any better in the next 2 weeks.

## 2021-12-25 ENCOUNTER — Other Ambulatory Visit (HOSPITAL_BASED_OUTPATIENT_CLINIC_OR_DEPARTMENT_OTHER): Payer: Self-pay

## 2021-12-25 ENCOUNTER — Other Ambulatory Visit (HOSPITAL_BASED_OUTPATIENT_CLINIC_OR_DEPARTMENT_OTHER): Payer: Self-pay | Admitting: Family Medicine

## 2021-12-25 ENCOUNTER — Other Ambulatory Visit (HOSPITAL_BASED_OUTPATIENT_CLINIC_OR_DEPARTMENT_OTHER): Payer: Self-pay | Admitting: Nurse Practitioner

## 2021-12-25 DIAGNOSIS — F419 Anxiety disorder, unspecified: Secondary | ICD-10-CM

## 2021-12-25 DIAGNOSIS — E1165 Type 2 diabetes mellitus with hyperglycemia: Secondary | ICD-10-CM

## 2021-12-25 DIAGNOSIS — F32A Depression, unspecified: Secondary | ICD-10-CM

## 2021-12-25 DIAGNOSIS — F409 Phobic anxiety disorder, unspecified: Secondary | ICD-10-CM

## 2021-12-25 DIAGNOSIS — M25561 Pain in right knee: Secondary | ICD-10-CM | POA: Insufficient documentation

## 2021-12-25 LAB — HEMOGLOBIN A1C
Est. average glucose Bld gHb Est-mCnc: 128 mg/dL
Hgb A1c MFr Bld: 6.1 % — ABNORMAL HIGH (ref 4.8–5.6)

## 2021-12-25 LAB — CBC WITH DIFFERENTIAL/PLATELET
Basophils Absolute: 0.1 x10E3/uL (ref 0.0–0.2)
Basos: 1 %
EOS (ABSOLUTE): 0.2 x10E3/uL (ref 0.0–0.4)
Eos: 2 %
Hematocrit: 40.4 % (ref 34.0–46.6)
Hemoglobin: 13.8 g/dL (ref 11.1–15.9)
Immature Grans (Abs): 0 x10E3/uL (ref 0.0–0.1)
Immature Granulocytes: 0 %
Lymphocytes Absolute: 3.3 x10E3/uL — ABNORMAL HIGH (ref 0.7–3.1)
Lymphs: 35 %
MCH: 30.3 pg (ref 26.6–33.0)
MCHC: 34.2 g/dL (ref 31.5–35.7)
MCV: 89 fL (ref 79–97)
Monocytes Absolute: 0.6 x10E3/uL (ref 0.1–0.9)
Monocytes: 6 %
Neutrophils Absolute: 5.1 x10E3/uL (ref 1.4–7.0)
Neutrophils: 56 %
Platelets: 288 x10E3/uL (ref 150–450)
RBC: 4.56 x10E6/uL (ref 3.77–5.28)
RDW: 11.9 % (ref 11.7–15.4)
WBC: 9.2 x10E3/uL (ref 3.4–10.8)

## 2021-12-25 LAB — COMPREHENSIVE METABOLIC PANEL
ALT: 34 IU/L — ABNORMAL HIGH (ref 0–32)
AST: 27 IU/L (ref 0–40)
Albumin/Globulin Ratio: 2 (ref 1.2–2.2)
Albumin: 4.7 g/dL (ref 3.8–4.8)
Alkaline Phosphatase: 145 IU/L — ABNORMAL HIGH (ref 44–121)
BUN/Creatinine Ratio: 19 (ref 9–23)
BUN: 14 mg/dL (ref 6–24)
Bilirubin Total: 0.3 mg/dL (ref 0.0–1.2)
CO2: 20 mmol/L (ref 20–29)
Calcium: 9.3 mg/dL (ref 8.7–10.2)
Chloride: 104 mmol/L (ref 96–106)
Creatinine, Ser: 0.72 mg/dL (ref 0.57–1.00)
Globulin, Total: 2.4 g/dL (ref 1.5–4.5)
Glucose: 104 mg/dL — ABNORMAL HIGH (ref 70–99)
Potassium: 4.2 mmol/L (ref 3.5–5.2)
Sodium: 141 mmol/L (ref 134–144)
Total Protein: 7.1 g/dL (ref 6.0–8.5)
eGFR: 102 mL/min/{1.73_m2} (ref >59)

## 2021-12-25 NOTE — Progress Notes (Signed)
Established Patient Office Visit  Subjective:  Patient ID: Haley Charles, female    DOB: Jan 29, 1971  Age: 51 y.o. MRN: [id number]  CC:  Chief Complaint  Patient presents with   Follow-up    Patient presents today for 6 mo f/o on DM. She is doing well, has no concerns or complaints today. She does not need any medication refills today. Patient is not in need of any referrals today.    Joint Pain    Patient states she is still having knee and joint pain even though she has not taken the crestor for 2 weeks now.     HPI Haley Charles presents for follow-up for diabetes. She reports she is doing well with her current regimen. She has not had any side effects from the Ozempic and has been losing weight and having less cravings. She has been able to keep her blood sugar well controlled and is following a low carb low fat diet. She denies paresthesias, increased hunger, increased thirst, or increased urination. No CP, ShOB, palpitations.   She is having joint pain in her knees. It was thought to be due to rosuvastatin and she has been off of this for two weeks. The pain is still present at this time. It is not exacerbated by anything, it is always present. It is an aching. She is having difficulty getting up and down due to the pain and it is limiting her ability to play with her grandchildren and be as active as she would like.   Past Medical History:  Diagnosis Date   Acute flank pain 06/18/2018   Anxiety    Colon polyp    Depression    Diverticulosis    Encounter to establish care 03/04/2021   Enteritis    History of in colonoscopy 2006   Flank pain 06/18/2018   GERD (gastroesophageal reflux disease)    History of kidney stones    Peripheral vascular disease (HCC)    right leg   PONV (postoperative nausea and vomiting)    Pyelonephritis 06/23/2018   Renal calculus, right 06/21/2018    Past Surgical History:  Procedure Laterality Date   APPENDECTOMY     COLONOSCOPY  04/16/2018   IR  URETERAL STENT RIGHT NEW ACCESS W/O SEP NEPHROSTOMY CATH  06/18/2018   NEPHROLITHOTOMY Right 06/21/2018   Procedure: RIGHT NEPHROLITHOTOMY PERCUTANEOUS;  Surgeon: Kathie Rhodes, MD;  Location: [facility];  Service: Urology;  Laterality: Right;   TUBAL LIGATION  21 years ago   UPPER GI ENDOSCOPY      Family History  Problem Relation Age of Onset   Colon polyps Mother    Esophagitis Mother    Esophageal cancer Mother    Cancer Mother    Stroke Mother    CAD Mother    CAD Father    Hypertension Father    Diabetes Mellitus II Father    Lung cancer Father    Cancer Father    Rectal cancer Neg Hx    Stomach cancer Neg Hx    Colon cancer Neg Hx     Social History   Socioeconomic History   Marital status: Significant Other    Spouse name: Bud   Number of children: 3   Years of education: Not on file   Highest education level: Not on file  Occupational History   Occupation: VP-Heat and Animal nutritionist  Tobacco Use   Smoking status: Former    Packs/day: 1.00    Years: 14.00  Pack years: 14.00    Types: Cigarettes    Quit date: 2009    Years since quitting: 14.0   Smokeless tobacco: Never  Vaping Use   Vaping Use: Never used  Substance and Sexual Activity   Alcohol use: Yes    Comment: occassional   Drug use: Not Currently   Sexual activity: Yes    Birth control/protection: Surgical  Other Topics Concern   Not on file  Social History Narrative   She currently lives with her significant other, Bud, and has three adult children of her own and three step-children from Bud. She has 5 grandchildren.    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    Social Determinants of Health   Financial Resource Strain: Not on file  Food Insecurity: Not on file  Transportation Needs: No Transportation Needs   Lack of Transportation (Medical): No   Lack of Transportation (Non-Medical): No  Physical Activity: Inactive   Days of Exercise per Week: 0 days   Minutes of  Exercise per Session: 0 min  Stress: Stress Concern Present   Feeling of Stress : Very much  Social Connections: Not on file  Intimate Partner Violence: Not At Risk   Fear of Current or Ex-Partner: No   Emotionally Abused: No   Physically Abused: No   Sexually Abused: No    Outpatient Medications Prior to Visit  Medication Sig Dispense Refill   conjugated estrogens (PREMARIN) vaginal cream Place 1 Applicatorful vaginally daily. 42.5 g 12   FLUoxetine (PROZAC) 20 MG capsule Take one tablet (43m) by mouth every morning. 90 capsule 2   hydrOXYzine (ATARAX/VISTARIL) 50 MG tablet Take 1 tablet (50 mg total) by mouth at bedtime and may repeat dose one time if needed. For sleep. 90 tablet 1   ondansetron (ZOFRAN) 4 MG tablet Take 1 tablet (4 mg total) by mouth every 8 (eight) hours as needed for nausea or vomiting. 30 tablet 3   metFORMIN (GLUCOPHAGE) 1000 MG tablet Take 1 tablet (1,000 mg total) by mouth 2 (two) times daily with a meal. 180 tablet 3   Semaglutide,0.25 or 0.5MG/DOS, (OZEMPIC, 0.25 OR 0.5 MG/DOSE,) 2 MG/1.5ML SOPN Inject 0.558minto the skin once a week. 4.5 mL 6   blood glucose meter kit and supplies KIT Testing every morning before eating or drinking and after each meal, up to three additional times a day as needed. 1 each 99   rosuvastatin (CRESTOR) 20 MG tablet Take 1/2 tab (1064mat bedtime for 14 days then increase to 1 tab (21m34mt bedtime. (Patient not taking: Reported on 12/24/2021) 90 tablet 3   No facility-administered medications prior to visit.    Allergies  Allergen Reactions   Latex Rash    ROS Review of Systems All review of systems negative except what is listed in the HPI    Objective:    Physical Exam Vitals and nursing note reviewed.  Constitutional:      Appearance: Normal appearance.  HENT:     Head: Normocephalic.  Eyes:     Extraocular Movements: Extraocular movements intact.     Conjunctiva/sclera: Conjunctivae normal.     Pupils: Pupils  are equal, round, and reactive to light.  Neck:     Vascular: No carotid bruit.  Cardiovascular:     Rate and Rhythm: Normal rate and regular rhythm.     Pulses: Normal pulses.     Heart sounds: Normal heart sounds.  Pulmonary:  Effort: Pulmonary effort is normal.     Breath sounds: Normal breath sounds.  Musculoskeletal:        General: Tenderness present. No swelling.     Cervical back: Normal range of motion.     Right lower leg: No edema.     Left lower leg: No edema.     Comments: Generalized bilateral knee tenderness with no decreased ROM or crepitus.   Skin:    General: Skin is warm and dry.     Capillary Refill: Capillary refill takes less than 2 seconds.  Neurological:     General: No focal deficit present.     Mental Status: She is alert and oriented to person, place, and time.  Psychiatric:        Mood and Affect: Mood normal.        Behavior: Behavior normal.        Thought Content: Thought content normal.        Judgment: Judgment normal.    BP 130/68    Pulse 93    Ht 5' 2"  (1.575 m)    Wt 169 lb 4.8 oz (76.8 kg)    SpO2 97%    BMI 30.97 kg/m  Wt Readings from Last 3 Encounters:  12/24/21 169 lb 4.8 oz (76.8 kg)  06/11/21 180 lb (81.6 kg)  03/11/21 191 lb 3.2 oz (86.7 kg)     Health Maintenance Due  Topic Date Due   COVID-19 Vaccine (1) Never done   Pneumococcal Vaccine 6-46 Years old (1 - PCV) Never done   OPHTHALMOLOGY EXAM  Never done   URINE MICROALBUMIN  Never done   TETANUS/TDAP  Never done   INFLUENZA VACCINE  Never done    There are no preventive care reminders to display for this patient.  Lab Results  Component Value Date   TSH 1.410 03/08/2021   Lab Results  Component Value Date   WBC 9.2 12/24/2021   HGB 13.8 12/24/2021   HCT 40.4 12/24/2021   MCV 89 12/24/2021   PLT 288 12/24/2021   Lab Results  Component Value Date   NA 141 12/24/2021   K 4.2 12/24/2021   CO2 20 12/24/2021   GLUCOSE 104 (H) 12/24/2021   BUN 14  12/24/2021   CREATININE 0.72 12/24/2021   BILITOT 0.3 12/24/2021   ALKPHOS 145 (H) 12/24/2021   AST 27 12/24/2021   ALT 34 (H) 12/24/2021   PROT 7.1 12/24/2021   ALBUMIN 4.7 12/24/2021   CALCIUM 9.3 12/24/2021   ANIONGAP 10 03/08/2021   EGFR 102 12/24/2021   GFR 95.76 04/15/2018   Lab Results  Component Value Date   CHOL 248 (H) 06/11/2021   Lab Results  Component Value Date   HDL 35 (L) 06/11/2021   Lab Results  Component Value Date   LDLCALC 127 (H) 06/11/2021   Lab Results  Component Value Date   TRIG 476 (H) 06/11/2021   Lab Results  Component Value Date   CHOLHDL 7.1 (H) 06/11/2021   Lab Results  Component Value Date   HGBA1C 6.1 (H) 12/24/2021      Assessment & Plan:   Problem List Items Addressed This Visit     BMI 30.0-30.9,adult - Primary    Initial BMI 35, now down to 30.97 with weight loss of over 20 lbs since initiation of Ozempic and weight loss efforts.  She is doing fantastic with her diet, activity, and medication at this time.  Recommend continuation of efforts.  Will increase  Ozempic to 72m dose as she has had a lull in weight loss in recent weeks and her A1c can still use improvement.  Plan to f/u in 6 months or sooner if needed.       Relevant Medications   Semaglutide, 1 MG/DOSE, 4 MG/3ML SOPN   Type 2 diabetes mellitus with hyperglycemia, without long-term current use of insulin (HCC)    Doing very well on current regimen and with lifestyle changes.  She does report mild increase in cravings and lull in weight gain in recent weeks. She is still on the 0.537mdose, so we will plan to increase to the 65m84mosage to see if we can get improved control.  Will check A1c today and plan to follow-up in 6 months. If labs show need for additional f/u we will plan accordingly.       Relevant Medications   Semaglutide, 1 MG/DOSE, 4 MG/3ML SOPN   Other Relevant Orders   CBC with Differential/Platelet (Completed)   Comprehensive metabolic panel  (Completed)   Hemoglobin A1c (Completed)   Combined hyperlipidemia associated with type 2 diabetes mellitus ([facility]  Unable to tolerate statin therapy due to joint pain.  At this time, we will monitor her efforts with improved BG control, diet, and exercise.  She may benefit from alternative medications, but we will get labs today and monitor. Would like to have resolution of the joint pain prior to adding any new treatments.       Relevant Medications   Semaglutide, 1 MG/DOSE, 4 MG/3ML SOPN   Arthralgia of both knees    Bilateral pain in the knees after starting rosuvastatin. She has stopped the medication at this time and has been off for about 2 weeks. She has not noticed any improvement of the pain as of yet. It is unclear at this time if the rosuvastatin was the culprit giving the lack of improvement, but I recommend giving this another 2-3 weeks to see if it resolves. If no improvement, consider other culprit for the pain and coincidental start with the rosuvastatin. Will check labs today and monitor.       Relevant Medications   meloxicam (MOBIC) 15 MG tablet   Mixed hyperlipidemia   Relevant Medications   Semaglutide, 1 MG/DOSE, 4 MG/3ML SOPN   Other Relevant Orders   CBC with Differential/Platelet (Completed)   Comprehensive metabolic panel (Completed)   Hemoglobin A1c (Completed)    Meds ordered this encounter  Medications   meloxicam (MOBIC) 15 MG tablet    Sig: Take 1 tablet (15 mg total) by mouth daily.    Dispense:  30 tablet    Refill:  11   Semaglutide, 1 MG/DOSE, 4 MG/3ML SOPN    Sig: Inject 1 mg as directed once a week.    Dispense:  9 mL    Refill:  1    Follow-up: Return in about 6 months (around 06/23/2022) for Weight.    SarOrma RenderP

## 2021-12-25 NOTE — Assessment & Plan Note (Signed)
Initial BMI 35, now down to 30.97 with weight loss of over 20 lbs since initiation of Ozempic and weight loss efforts.  She is doing fantastic with her diet, activity, and medication at this time.  Recommend continuation of efforts.  Will increase Ozempic to 1mg  dose as she has had a lull in weight loss in recent weeks and her A1c can still use improvement.  Plan to f/u in 6 months or sooner if needed.

## 2021-12-25 NOTE — Assessment & Plan Note (Signed)
Doing very well on current regimen and with lifestyle changes.  She does report mild increase in cravings and lull in weight gain in recent weeks. She is still on the 0.5mg  dose, so we will plan to increase to the 1mg  dosage to see if we can get improved control.  Will check A1c today and plan to follow-up in 6 months. If labs show need for additional f/u we will plan accordingly.

## 2021-12-25 NOTE — Assessment & Plan Note (Signed)
Unable to tolerate statin therapy due to joint pain.  At this time, we will monitor her efforts with improved BG control, diet, and exercise.  She may benefit from alternative medications, but we will get labs today and monitor. Would like to have resolution of the joint pain prior to adding any new treatments.

## 2021-12-25 NOTE — Assessment & Plan Note (Signed)
Bilateral pain in the knees after starting rosuvastatin. She has stopped the medication at this time and has been off for about 2 weeks. She has not noticed any improvement of the pain as of yet. It is unclear at this time if the rosuvastatin was the culprit giving the lack of improvement, but I recommend giving this another 2-3 weeks to see if it resolves. If no improvement, consider other culprit for the pain and coincidental start with the rosuvastatin. Will check labs today and monitor.

## 2021-12-31 ENCOUNTER — Other Ambulatory Visit (HOSPITAL_BASED_OUTPATIENT_CLINIC_OR_DEPARTMENT_OTHER): Payer: Self-pay

## 2021-12-31 MED ORDER — ESTRADIOL 0.1 MG/GM VA CREA: 1.0000 | TOPICAL_CREAM | Freq: Every day | VAGINAL | 12 refills | Status: DC

## 2022-01-02 ENCOUNTER — Other Ambulatory Visit (HOSPITAL_BASED_OUTPATIENT_CLINIC_OR_DEPARTMENT_OTHER): Payer: Self-pay

## 2022-05-12 ENCOUNTER — Encounter (HOSPITAL_BASED_OUTPATIENT_CLINIC_OR_DEPARTMENT_OTHER): Payer: Self-pay | Admitting: Nurse Practitioner

## 2022-05-13 ENCOUNTER — Other Ambulatory Visit (HOSPITAL_BASED_OUTPATIENT_CLINIC_OR_DEPARTMENT_OTHER): Payer: Self-pay

## 2022-05-13 DIAGNOSIS — F419 Anxiety disorder, unspecified: Secondary | ICD-10-CM

## 2022-05-13 MED ORDER — FLUOXETINE HCL 20 MG PO CAPS: ORAL_CAPSULE | ORAL | 2 refills | Status: DC

## 2022-06-17 ENCOUNTER — Encounter (HOSPITAL_BASED_OUTPATIENT_CLINIC_OR_DEPARTMENT_OTHER): Payer: Self-pay | Admitting: Nurse Practitioner

## 2022-06-17 DIAGNOSIS — E782 Mixed hyperlipidemia: Secondary | ICD-10-CM

## 2022-06-17 DIAGNOSIS — E1165 Type 2 diabetes mellitus with hyperglycemia: Secondary | ICD-10-CM

## 2022-06-17 DIAGNOSIS — Z6832 Body mass index (BMI) 32.0-32.9, adult: Secondary | ICD-10-CM

## 2022-06-17 MED ORDER — SEMAGLUTIDE (2 MG/DOSE) 8 MG/3ML ~~LOC~~ SOPN: 2.0000 mg | PEN_INJECTOR | SUBCUTANEOUS | 6 refills | Status: DC

## 2022-06-23 ENCOUNTER — Encounter (HOSPITAL_BASED_OUTPATIENT_CLINIC_OR_DEPARTMENT_OTHER): Payer: Self-pay | Admitting: Nurse Practitioner

## 2022-06-23 ENCOUNTER — Ambulatory Visit (INDEPENDENT_AMBULATORY_CARE_PROVIDER_SITE_OTHER): Payer: Commercial Managed Care - PPO | Admitting: Nurse Practitioner

## 2022-06-23 ENCOUNTER — Ambulatory Visit (HOSPITAL_BASED_OUTPATIENT_CLINIC_OR_DEPARTMENT_OTHER): Payer: Commercial Managed Care - PPO | Admitting: Nurse Practitioner

## 2022-06-23 VITALS — BP 131/67 | HR 86 | Ht 62.0 in | Wt 163.8 lb

## 2022-06-23 DIAGNOSIS — I708 Atherosclerosis of other arteries: Secondary | ICD-10-CM

## 2022-06-23 DIAGNOSIS — N951 Menopausal and female climacteric states: Secondary | ICD-10-CM

## 2022-06-23 DIAGNOSIS — Z6830 Body mass index (BMI) 30.0-30.9, adult: Secondary | ICD-10-CM

## 2022-06-23 DIAGNOSIS — Z6835 Body mass index (BMI) 35.0-35.9, adult: Secondary | ICD-10-CM

## 2022-06-23 DIAGNOSIS — E1165 Type 2 diabetes mellitus with hyperglycemia: Secondary | ICD-10-CM

## 2022-06-23 DIAGNOSIS — I700 Atherosclerosis of aorta: Secondary | ICD-10-CM

## 2022-06-23 DIAGNOSIS — E1169 Type 2 diabetes mellitus with other specified complication: Secondary | ICD-10-CM | POA: Diagnosis not present

## 2022-06-23 DIAGNOSIS — E782 Mixed hyperlipidemia: Secondary | ICD-10-CM

## 2022-06-23 NOTE — Progress Notes (Signed)
Worthy Keeler, DNP, AGNP-c [address], [place] [address] [phone] Office [phone] Fax  ESTABLISHED PATIENT- Chronic Health and/or Follow-Up Visit  Blood pressure 131/67, pulse 86, height '5\' 2"'$  (1.575 m), weight 163 lb 12.8 oz (74.3 kg), SpO2 100 %.  Follow-up (Pt here for f/u on weight, no other concerns at all )   HPI  Haley Charles  is a 51 y.o. year old female presenting today for evaluation and management of the following: DM and weight No issues with constipation- taking Alli three times a week to help with this issue She is watching her diet very closely and keeping her unhealthy food options to a minimum Working out every other day with weight resistance bands No symptoms of high or low BG No fullness in the neck or hoarseness HRT Her insurance has changed and is no longer covering her previous medication. She would like for me see if there is an estrogen gel that would be covered. .   ROS All ROS negative with exception of what is listed in HPI  PHYSICAL EXAM Physical Exam Vitals and nursing note reviewed.  Constitutional:      General: She is not in acute distress.    Appearance: Normal appearance.  HENT:     Head: Normocephalic.  Eyes:     Extraocular Movements: Extraocular movements intact.     Conjunctiva/sclera: Conjunctivae normal.     Pupils: Pupils are equal, round, and reactive to light.  Neck:     Vascular: No carotid bruit.  Cardiovascular:     Rate and Rhythm: Normal rate and regular rhythm.     Pulses: Normal pulses.     Heart sounds: Normal heart sounds. No murmur heard. Pulmonary:     Effort: Pulmonary effort is normal.     Breath sounds: Normal breath sounds. No wheezing.  Abdominal:     General: Bowel sounds are normal. There is no distension.     Palpations: Abdomen is soft.     Tenderness: There is no abdominal tenderness. There is no guarding.   Musculoskeletal:        General: Normal range of motion.     Cervical back: Normal range of motion and neck supple.     Right lower leg: No edema.     Left lower leg: No edema.  Lymphadenopathy:     Cervical: No cervical adenopathy.  Skin:    General: Skin is warm and dry.     Capillary Refill: Capillary refill takes less than 2 seconds.  Neurological:     General: No focal deficit present.     Mental Status: She is alert and oriented to person, place, and time.  Psychiatric:        Mood and Affect: Mood normal.        Behavior: Behavior normal.        Thought Content: Thought content normal.        Judgment: Judgment normal.     ASSESSMENT & PLAN Problem List Items Addressed This Visit     Vaginal dryness, menopausal    She has been on estradiol in the past, but this was not helpful for symptoms. We did change her back to premarin, but her insurance no longer covers this. We will plan to trial topical estrogen applied to the thigh daily to see if this is helpful. This does appear to be covered by her insurance. No alarm sx today.  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 Recommend continue diet and exercise and medication management for lipids, weight , and Blood glucose. Labs today.       Relevant Medications   atorvastatin (LIPITOR) 20 MG tablet   Other Relevant Orders   CBC with Differential/Platelet (Completed)   Comprehensive metabolic panel (Completed)   Lipid panel (Completed)   Hemoglobin A1c (Completed)   TSH (Completed)   T4, free (Completed)   BMI 30.0-30.9,adult    Effective weight management with diet, exercise, and semaglutide. She has made good progress and is eager to continue with her current treatment. We will plan to obtain labs today and monitor closely. BMI has dropped from 36.64 to 29, which is fantastic. We will continue to monitor  closely.       Type 2 diabetes mellitus with hyperglycemia, without long-term current use of insulin (HCC) - Primary    BG currently controlled with diet, exercise, and Semaglutide injections once weekly. She is on statin therapy. Foot exam normal. Other labs pending today. Recommend continue with diet and exercise- increasing whole grains and healthy proteins.       Relevant Medications   atorvastatin (LIPITOR) 20 MG tablet   Other Relevant Orders   CBC with Differential/Platelet (Completed)   Comprehensive metabolic panel (Completed)   Lipid panel (Completed)   Hemoglobin A1c (Completed)   TSH (Completed)   T4, free (Completed)   Combined hyperlipidemia associated with type 2 diabetes mellitus (HCC)    Chronic. Labs today. Hopeful we will see improvement with diet changes and use of semaglutide. Statin therapy in place.        Relevant Medications   atorvastatin (LIPITOR) 20 MG tablet   Other Relevant Orders   CBC with Differential/Platelet (Completed)   Comprehensive metabolic panel (Completed)   Lipid panel (Completed)   Hemoglobin A1c (Completed)   TSH (Completed)   T4, free (Completed)   Other Visit Diagnoses     BMI 35.0-35.9,adult       Relevant Orders   CBC with Differential/Platelet (Completed)   Comprehensive metabolic panel (Completed)   Lipid panel (Completed)   Hemoglobin A1c (Completed)   TSH (Completed)   T4, free (Completed)        FOLLOW-UP Return in about 6 months (around 12/24/2022) for Weight Follow-up.   Worthy Keeler, DNP, AGNP-c 06/23/2022  9:18 AM

## 2022-06-23 NOTE — Patient Instructions (Signed)
I will look up the estrogen cream and we will see what we can find that is covered by insurance. Hopefully the premarin will be covered again now that insurance has changed.   I will let you know what your labs are showing and we will update as needed.

## 2022-06-24 LAB — COMPREHENSIVE METABOLIC PANEL
ALT: 38 IU/L — ABNORMAL HIGH (ref 0–32)
AST: 26 IU/L (ref 0–40)
Albumin/Globulin Ratio: 1.9 (ref 1.2–2.2)
Albumin: 4.7 g/dL (ref 3.9–4.9)
Alkaline Phosphatase: 122 IU/L — ABNORMAL HIGH (ref 44–121)
BUN/Creatinine Ratio: 25 — ABNORMAL HIGH (ref 9–23)
BUN: 17 mg/dL (ref 6–24)
Bilirubin Total: 0.5 mg/dL (ref 0.0–1.2)
CO2: 22 mmol/L (ref 20–29)
Calcium: 9.8 mg/dL (ref 8.7–10.2)
Chloride: 106 mmol/L (ref 96–106)
Creatinine, Ser: 0.68 mg/dL (ref 0.57–1.00)
Globulin, Total: 2.5 g/dL (ref 1.5–4.5)
Glucose: 102 mg/dL — ABNORMAL HIGH (ref 70–99)
Potassium: 4.5 mmol/L (ref 3.5–5.2)
Sodium: 142 mmol/L (ref 134–144)
Total Protein: 7.2 g/dL (ref 6.0–8.5)
eGFR: 106 mL/min/{1.73_m2} (ref >59)

## 2022-06-24 LAB — CBC WITH DIFFERENTIAL/PLATELET
Basophils Absolute: 0.1 10*3/uL (ref 0.0–0.2)
Basos: 1 %
EOS (ABSOLUTE): 0.1 10*3/uL (ref 0.0–0.4)
Eos: 1 %
Hematocrit: 43 % (ref 34.0–46.6)
Hemoglobin: 14.2 g/dL (ref 11.1–15.9)
Immature Grans (Abs): 0 10*3/uL (ref 0.0–0.1)
Immature Granulocytes: 0 %
Lymphocytes Absolute: 3.2 10*3/uL — ABNORMAL HIGH (ref 0.7–3.1)
Lymphs: 35 %
MCH: 29.3 pg (ref 26.6–33.0)
MCHC: 33 g/dL (ref 31.5–35.7)
MCV: 89 fL (ref 79–97)
Monocytes Absolute: 0.6 10*3/uL (ref 0.1–0.9)
Monocytes: 7 %
Neutrophils Absolute: 5 10*3/uL (ref 1.4–7.0)
Neutrophils: 56 %
Platelets: 294 10*3/uL (ref 150–450)
RBC: 4.85 x10E6/uL (ref 3.77–5.28)
RDW: 12.1 % (ref 11.7–15.4)
WBC: 9 10*3/uL (ref 3.4–10.8)

## 2022-06-24 LAB — HEMOGLOBIN A1C
Est. average glucose Bld gHb Est-mCnc: 111 mg/dL
Hgb A1c MFr Bld: 5.5 % (ref 4.8–5.6)

## 2022-06-24 LAB — TSH: TSH: 0.01 u[IU]/mL — ABNORMAL LOW (ref 0.450–4.500)

## 2022-06-24 LAB — LIPID PANEL
Chol/HDL Ratio: 4.9 ratio — ABNORMAL HIGH (ref 0.0–4.4)
Cholesterol, Total: 211 mg/dL — ABNORMAL HIGH (ref 100–199)
HDL: 43 mg/dL (ref >39)
LDL Chol Calc (NIH): 145 mg/dL — ABNORMAL HIGH (ref 0–99)
Triglycerides: 129 mg/dL (ref 0–149)
VLDL Cholesterol Cal: 23 mg/dL (ref 5–40)

## 2022-06-24 LAB — T4, FREE: Free T4: 1.64 ng/dL (ref 0.82–1.77)

## 2022-06-27 MED ORDER — ATORVASTATIN CALCIUM 20 MG PO TABS: 20.0000 mg | ORAL_TABLET | Freq: Every day | ORAL | 3 refills | Status: DC

## 2022-06-28 ENCOUNTER — Encounter (HOSPITAL_BASED_OUTPATIENT_CLINIC_OR_DEPARTMENT_OTHER): Payer: Self-pay | Admitting: Nurse Practitioner

## 2022-07-02 ENCOUNTER — Other Ambulatory Visit (HOSPITAL_BASED_OUTPATIENT_CLINIC_OR_DEPARTMENT_OTHER): Payer: Self-pay | Admitting: Nurse Practitioner

## 2022-07-02 DIAGNOSIS — R748 Abnormal levels of other serum enzymes: Secondary | ICD-10-CM

## 2022-07-11 ENCOUNTER — Ambulatory Visit
Admission: RE | Admit: 2022-07-11 | Discharge: 2022-07-11 | Disposition: A | Discharge status: Home / Self Care | Payer: Commercial Managed Care - PPO | Source: Ambulatory Visit | Attending: Nurse Practitioner | Admitting: Nurse Practitioner

## 2022-07-11 DIAGNOSIS — R748 Abnormal levels of other serum enzymes: Secondary | ICD-10-CM

## 2022-07-15 ENCOUNTER — Other Ambulatory Visit (HOSPITAL_BASED_OUTPATIENT_CLINIC_OR_DEPARTMENT_OTHER): Payer: Self-pay | Admitting: Nurse Practitioner

## 2022-07-15 DIAGNOSIS — E1165 Type 2 diabetes mellitus with hyperglycemia: Secondary | ICD-10-CM

## 2022-07-15 DIAGNOSIS — Z6832 Body mass index (BMI) 32.0-32.9, adult: Secondary | ICD-10-CM

## 2022-07-15 DIAGNOSIS — F32A Depression, unspecified: Secondary | ICD-10-CM

## 2022-07-15 DIAGNOSIS — F409 Phobic anxiety disorder, unspecified: Secondary | ICD-10-CM

## 2022-07-15 DIAGNOSIS — E782 Mixed hyperlipidemia: Secondary | ICD-10-CM

## 2022-08-03 ENCOUNTER — Encounter (HOSPITAL_BASED_OUTPATIENT_CLINIC_OR_DEPARTMENT_OTHER): Payer: Self-pay | Admitting: Nurse Practitioner

## 2022-08-03 MED ORDER — ESTRADIOL 0.25 MG/0.25GM TD GEL: TRANSDERMAL | 11 refills | Status: DC

## 2022-08-03 NOTE — Assessment & Plan Note (Signed)
Effective weight management with diet, exercise, and semaglutide. She has made good progress and is eager to continue with her current treatment. We will plan to obtain labs today and monitor closely. BMI has dropped from 36.64 to 29, which is fantastic. We will continue to monitor closely.

## 2022-08-03 NOTE — Assessment & Plan Note (Signed)
She has been on estradiol in the past, but this was not helpful for symptoms. We did change her back to premarin, but her insurance no longer covers this. We will plan to trial topical estrogen applied to the thigh daily to see if this is helpful. This does appear to be covered by her insurance. No alarm sx today.

## 2022-08-03 NOTE — Assessment & Plan Note (Signed)
Chronic. Labs today. Hopeful we will see improvement with diet changes and use of semaglutide. Statin therapy in place.

## 2022-08-03 NOTE — Assessment & Plan Note (Signed)
BG currently controlled with diet, exercise, and Semaglutide injections once weekly. She is on statin therapy. Foot exam normal. Other labs pending today. Recommend continue with diet and exercise- increasing whole grains and healthy proteins.

## 2022-08-03 NOTE — Assessment & Plan Note (Signed)
No concerning symptoms. Recommend continue diet and exercise and medication management for lipids, weight , and Blood glucose. Labs today.

## 2022-08-04 ENCOUNTER — Other Ambulatory Visit (HOSPITAL_BASED_OUTPATIENT_CLINIC_OR_DEPARTMENT_OTHER): Payer: Self-pay | Admitting: Nurse Practitioner

## 2022-08-04 ENCOUNTER — Telehealth (HOSPITAL_BASED_OUTPATIENT_CLINIC_OR_DEPARTMENT_OTHER): Payer: Self-pay | Admitting: Nurse Practitioner

## 2022-08-04 DIAGNOSIS — F409 Phobic anxiety disorder, unspecified: Secondary | ICD-10-CM

## 2022-08-04 DIAGNOSIS — F419 Anxiety disorder, unspecified: Secondary | ICD-10-CM

## 2022-08-04 NOTE — Telephone Encounter (Signed)
I called the pt asking about the Yearly Eye Exam --pt has not had one  Please send a Referral over to [facility] on [address]

## 2022-08-13 ENCOUNTER — Encounter (HOSPITAL_BASED_OUTPATIENT_CLINIC_OR_DEPARTMENT_OTHER): Payer: Self-pay | Admitting: Nurse Practitioner

## 2022-08-13 ENCOUNTER — Ambulatory Visit (INDEPENDENT_AMBULATORY_CARE_PROVIDER_SITE_OTHER): Payer: Commercial Managed Care - PPO | Admitting: Nurse Practitioner

## 2022-08-13 VITALS — BP 111/51 | HR 87 | Ht 62.0 in | Wt 155.9 lb

## 2022-08-13 DIAGNOSIS — Z23 Encounter for immunization: Secondary | ICD-10-CM

## 2022-08-13 DIAGNOSIS — F900 Attention-deficit hyperactivity disorder, predominantly inattentive type: Secondary | ICD-10-CM

## 2022-08-13 MED ORDER — AMPHETAMINE-DEXTROAMPHETAMINE 10 MG PO TABS: 10.0000 mg | ORAL_TABLET | Freq: Two times a day (BID) | ORAL | 0 refills | Status: DC

## 2022-08-13 NOTE — Patient Instructions (Signed)
I have sent in Adderall for you. This is the short acting formula of the medication and for most patients will work for about 4 hours. In some patients this can last longer. It will just be something you have to see how it will work for you.   I would start it with 1/2 tab in the morning and see how you feel with this. You can increase to 1 tab if this doesn't seem to be working well for you. If you feel like it is wearing off you can take the second dose in the afternoon-be sure to take this before 4 pm.

## 2022-08-13 NOTE — Progress Notes (Signed)
Worthy Keeler, DNP, AGNP-c [place], [place] [address] [phone] Office [phone] Fax  ESTABLISHED PATIENT- Chronic Health and/or Follow-Up Visit  Blood pressure (!) 111/51, pulse 87, height '5\' 2"'$  (1.575 m), weight 155 lb 14.4 oz (70.7 kg), SpO2 99 %.   Haley Charles  is a 51 y.o. year old female presenting today for evaluation and management of the following: Follow-up (Patient presents today to discuss ADD/ADHD. She has never taken medication.She needs flu shot and tetanus she would like them today)   BRIEF HISTORY, IMPRESSION, RECOMMENDATIONS, and ROS  1. Attention deficit hyperactivity disorder (ADHD), predominantly inattentive type Haley Charles tells me that she has had lifelong concerns with attention deficit and struggled significantly as a child in school.  She tells me that she often withdrew as she felt inferior and did not want to draw attention to herself.  She tells me that once she had children and they were diagnosed with attention deficit she realized the same symptoms in herself.  She has been managing without medication for quite some time now however recently she has noticed that she is finding it increasingly difficult to get her work completed in a timely and orderly fashion as she has been pulled in multiple directions many times a day.  She also tells me that she is noted some conflict at home as she really struggles to maintain organization and her focus.  She tells me that there are many moving parts in her life right now and she does feel that she may be able to benefit from medication to help with her attention deficit.  2. Need for vaccine for Td (tetanus-diphtheria) 3. Need for influenza vaccination She would like to have her tetanus and flu vaccine today.    All ROS negative with exception of what is listed above.   PHYSICAL EXAM Physical Exam Vitals and nursing note reviewed.   Constitutional:      Appearance: Normal appearance.  HENT:     Head: Normocephalic.  Eyes:     Extraocular Movements: Extraocular movements intact.     Pupils: Pupils are equal, round, and reactive to light.  Pulmonary:     Effort: Pulmonary effort is normal.  Musculoskeletal:        General: Normal range of motion.     Cervical back: Normal range of motion.  Skin:    General: Skin is warm and dry.  Neurological:     General: No focal deficit present.     Mental Status: She is alert and oriented to person, place, and time.  Psychiatric:        Mood and Affect: Mood normal.        Behavior: Behavior normal.        Thought Content: Thought content normal.        Judgment: Judgment normal.     PLAN Problem List Items Addressed This Visit     Attention deficit hyperactivity disorder (ADHD), predominantly inattentive type - Primary    Positive ADHD evaluation with lengthy discussion today concerning diagnosis and medication options that may be helpful.  We also discussed lifestyle changes that can help with the management of symptoms and to allow her to remain in control.  Joint decision made to start very low-dose Adderall short acting formula twice a day.  We discussed the option of starting with 5 mg in the morning and monitoring for improvement of symptoms.  She does have the option to  increase up to 10 mg if needed.  Encouraged the patient to monitor closely for need for afternoon dosage.  She can take this anywhere between noon and 3:00 for optimal performance.  Encouraged her to take holidays from the medication when doses are not needed.  We will go ahead and start on this medication today and monitor closely for improvement.  We will plan to follow-up in the next 3 to 4 weeks with telephone visit to see how she is doing and make dosage changes as appropriate at that time.  She will let me know at any time if she begins to have any unusual or concerning symptoms.      Relevant  Medications   amphetamine-dextroamphetamine (ADDERALL) 10 MG tablet   Other Visit Diagnoses     Need for vaccine for Td (tetanus-diphtheria)       Need for influenza vaccination       Relevant Orders   Tdap vaccine greater than or equal to 7yo IM (Completed)   Flu Vaccine QUAD 6+ mos PF IM (Fluarix Quad PF) (Completed)       Return for 3-4 week phone call ADHD.    Worthy Keeler, DNP, AGNP-c 08/13/2022 10:05 AM

## 2022-08-13 NOTE — Assessment & Plan Note (Signed)
Positive ADHD evaluation with lengthy discussion today concerning diagnosis and medication options that may be helpful.  We also discussed lifestyle changes that can help with the management of symptoms and to allow her to remain in control.  Joint decision made to start very low-dose Adderall short acting formula twice a day.  We discussed the option of starting with 5 mg in the morning and monitoring for improvement of symptoms.  She does have the option to increase up to 10 mg if needed.  Encouraged the patient to monitor closely for need for afternoon dosage.  She can take this anywhere between noon and 3:00 for optimal performance.  Encouraged her to take holidays from the medication when doses are not needed.  We will go ahead and start on this medication today and monitor closely for improvement.  We will plan to follow-up in the next 3 to 4 weeks with telephone visit to see how she is doing and make dosage changes as appropriate at that time.  She will let me know at any time if she begins to have any unusual or concerning symptoms.

## 2022-09-03 ENCOUNTER — Ambulatory Visit (INDEPENDENT_AMBULATORY_CARE_PROVIDER_SITE_OTHER): Payer: Commercial Managed Care - PPO | Admitting: Nurse Practitioner

## 2022-09-03 ENCOUNTER — Encounter (HOSPITAL_BASED_OUTPATIENT_CLINIC_OR_DEPARTMENT_OTHER): Payer: Self-pay | Admitting: Nurse Practitioner

## 2022-09-03 DIAGNOSIS — F900 Attention-deficit hyperactivity disorder, predominantly inattentive type: Secondary | ICD-10-CM

## 2022-09-03 MED ORDER — AMPHETAMINE-DEXTROAMPHETAMINE 10 MG PO TABS: 10.0000 mg | ORAL_TABLET | Freq: Every day | ORAL | 0 refills | Status: DC

## 2022-09-03 MED ORDER — AMPHETAMINE-DEXTROAMPHETAMINE 20 MG PO TABS: 20.0000 mg | ORAL_TABLET | Freq: Every morning | ORAL | 0 refills | Status: DC

## 2022-09-03 MED ORDER — AMPHETAMINE-DEXTROAMPHETAMINE 20 MG PO TABS: 20.0000 mg | ORAL_TABLET | Freq: Every day | ORAL | 0 refills | Status: DC

## 2022-09-03 NOTE — Assessment & Plan Note (Signed)
ADHD F/U for new start adderall. AM dosage does not appear to be working as well as the afternoon dosing. Discussion with patient about the option to increase the AM dose. She feels this would be helpful. Will plan to increase AM dose to '20mg'$  and PM dose can remain 5-'10mg'$  PRN. She will let me know if the '20mg'$  dose appears to be too much or if she notices any side effects. 3 months sent to pharmacy for patient. PDMP reviewed. F/U in 3 months

## 2022-09-03 NOTE — Progress Notes (Signed)
Virtual Visit Encounter telephone visit.   I connected with  Haley Charles on 09/03/22 at 10:10 AM EDT by secure audio telemedicine application. I verified that I am speaking with the correct person using two identifiers.   I introduced myself as a Designer, jewellery with the practice. The limitations of evaluation and management by telemedicine discussed with the patient and the availability of in person appointments. The patient expressed verbal understanding and consent to proceed.  Participating parties in this visit include: Myself and patient  The patient is: Patient Location: Home I am: Provider Location: Office/Clinic Subjective:    CC and HPI: Haley Charles is a 51 y.o. year old female presenting for follow up of adhd.  Patient reports the following: ADHD - she tells me that the medication seems to be working fairly well. She is feeling more focused during the work day. She tells me that in the mornings there are times that she feels that she could use a little more medication. She has been taking 1/2-1 tab in the afternoons when she has a longer day and feels that the 5-'10mg'$  in the afternoon is working very well.  She denies anxiety, palpitations, difficult sleeping, decreased appetite, or CP. She does endorse some headaches, but nothing significant per patient.    Past medical history, Surgical history, Family history not pertinant except as noted below, Social history, Allergies, and medications have been entered into the medical record, reviewed, and corrections made.   Review of Systems:  All review of systems negative except what is listed in the HPI  Objective:    Alert and oriented x 4 Speaking in clear sentences with no shortness of breath. No distress.  Impression and Recommendations:    Problem List Items Addressed This Visit   None   orders and follow up as documented in EMR I discussed the assessment and treatment plan with the patient. The patient was  provided an opportunity to ask questions and all were answered. The patient agreed with the plan and demonstrated an understanding of the instructions.   The patient was advised to call back or seek an in-person evaluation if the symptoms worsen or if the condition fails to improve as anticipated.  Follow-Up: in 3 months  I provided 12 minutes of non-face-to-face interaction with this non face-to-face encounter including intake, same-day documentation, and chart review.   Orma Render, NP , DNP, AGNP-c [place] [facility] [phone] (fax)

## 2022-09-19 ENCOUNTER — Encounter (HOSPITAL_BASED_OUTPATIENT_CLINIC_OR_DEPARTMENT_OTHER): Payer: Self-pay | Admitting: Nurse Practitioner

## 2022-09-21 ENCOUNTER — Other Ambulatory Visit (HOSPITAL_BASED_OUTPATIENT_CLINIC_OR_DEPARTMENT_OTHER): Payer: Self-pay | Admitting: Nurse Practitioner

## 2022-09-21 DIAGNOSIS — F900 Attention-deficit hyperactivity disorder, predominantly inattentive type: Secondary | ICD-10-CM

## 2022-09-22 ENCOUNTER — Other Ambulatory Visit (HOSPITAL_BASED_OUTPATIENT_CLINIC_OR_DEPARTMENT_OTHER): Payer: Self-pay

## 2022-09-22 DIAGNOSIS — Z122 Encounter for screening for malignant neoplasm of respiratory organs: Secondary | ICD-10-CM

## 2022-10-10 MED ORDER — AMPHETAMINE-DEXTROAMPHETAMINE 10 MG PO TABS: 10.0000 mg | ORAL_TABLET | Freq: Every day | ORAL | 0 refills | Status: DC

## 2022-10-18 ENCOUNTER — Encounter (HOSPITAL_BASED_OUTPATIENT_CLINIC_OR_DEPARTMENT_OTHER): Payer: Self-pay | Admitting: Nurse Practitioner

## 2022-11-26 ENCOUNTER — Other Ambulatory Visit (HOSPITAL_BASED_OUTPATIENT_CLINIC_OR_DEPARTMENT_OTHER): Payer: Self-pay | Admitting: Nurse Practitioner

## 2022-11-26 DIAGNOSIS — F900 Attention-deficit hyperactivity disorder, predominantly inattentive type: Secondary | ICD-10-CM

## 2022-11-26 MED ORDER — AMPHETAMINE-DEXTROAMPHETAMINE 10 MG PO TABS: 10.0000 mg | ORAL_TABLET | Freq: Every day | ORAL | 0 refills | Status: DC

## 2022-12-09 ENCOUNTER — Other Ambulatory Visit (HOSPITAL_BASED_OUTPATIENT_CLINIC_OR_DEPARTMENT_OTHER): Payer: Self-pay | Admitting: Nurse Practitioner

## 2022-12-09 DIAGNOSIS — F900 Attention-deficit hyperactivity disorder, predominantly inattentive type: Secondary | ICD-10-CM

## 2022-12-10 ENCOUNTER — Other Ambulatory Visit: Payer: Self-pay | Admitting: Nurse Practitioner

## 2022-12-10 DIAGNOSIS — F900 Attention-deficit hyperactivity disorder, predominantly inattentive type: Secondary | ICD-10-CM

## 2022-12-10 MED ORDER — AMPHETAMINE-DEXTROAMPHETAMINE 20 MG PO TABS: 20.0000 mg | ORAL_TABLET | Freq: Every morning | ORAL | 0 refills | Status: DC

## 2022-12-10 MED ORDER — AMPHETAMINE-DEXTROAMPHETAMINE 10 MG PO TABS: 10.0000 mg | ORAL_TABLET | Freq: Every day | ORAL | 0 refills | Status: DC

## 2022-12-10 MED ORDER — AMPHETAMINE-DEXTROAMPHETAMINE 20 MG PO TABS: 20.0000 mg | ORAL_TABLET | Freq: Every day | ORAL | 0 refills | Status: DC

## 2022-12-24 ENCOUNTER — Ambulatory Visit (HOSPITAL_BASED_OUTPATIENT_CLINIC_OR_DEPARTMENT_OTHER): Payer: 59 | Admitting: Nurse Practitioner

## 2022-12-25 ENCOUNTER — Other Ambulatory Visit (HOSPITAL_BASED_OUTPATIENT_CLINIC_OR_DEPARTMENT_OTHER): Payer: Self-pay | Admitting: Nurse Practitioner

## 2022-12-25 DIAGNOSIS — M25561 Pain in right knee: Secondary | ICD-10-CM

## 2023-01-09 ENCOUNTER — Other Ambulatory Visit: Payer: Self-pay | Admitting: Nurse Practitioner

## 2023-01-09 DIAGNOSIS — F900 Attention-deficit hyperactivity disorder, predominantly inattentive type: Secondary | ICD-10-CM

## 2023-01-12 MED ORDER — AMPHETAMINE-DEXTROAMPHETAMINE 20 MG PO TABS: 20.0000 mg | ORAL_TABLET | Freq: Every morning | ORAL | 0 refills | Status: DC

## 2023-01-26 ENCOUNTER — Other Ambulatory Visit: Payer: Self-pay | Admitting: Nurse Practitioner

## 2023-01-26 DIAGNOSIS — F900 Attention-deficit hyperactivity disorder, predominantly inattentive type: Secondary | ICD-10-CM

## 2023-01-26 NOTE — Telephone Encounter (Signed)
Refill request last apt 12/11/22.

## 2023-01-27 MED ORDER — AMPHETAMINE-DEXTROAMPHETAMINE 10 MG PO TABS: 10.0000 mg | ORAL_TABLET | Freq: Every day | ORAL | 0 refills | Status: DC

## 2023-02-04 ENCOUNTER — Other Ambulatory Visit (HOSPITAL_BASED_OUTPATIENT_CLINIC_OR_DEPARTMENT_OTHER): Payer: Self-pay | Admitting: Nurse Practitioner

## 2023-02-04 DIAGNOSIS — F32A Depression, unspecified: Secondary | ICD-10-CM

## 2023-02-04 NOTE — Telephone Encounter (Signed)
Refill request last apt was 09/03/22.

## 2023-02-24 ENCOUNTER — Other Ambulatory Visit: Payer: Self-pay | Admitting: Nurse Practitioner

## 2023-02-24 DIAGNOSIS — F900 Attention-deficit hyperactivity disorder, predominantly inattentive type: Secondary | ICD-10-CM

## 2023-02-24 NOTE — Telephone Encounter (Signed)
Refill requested

## 2023-02-25 MED ORDER — AMPHETAMINE-DEXTROAMPHETAMINE 10 MG PO TABS: 10.0000 mg | ORAL_TABLET | Freq: Every day | ORAL | 0 refills | Status: DC

## 2023-03-07 ENCOUNTER — Other Ambulatory Visit: Payer: Self-pay | Admitting: Nurse Practitioner

## 2023-03-07 DIAGNOSIS — F900 Attention-deficit hyperactivity disorder, predominantly inattentive type: Secondary | ICD-10-CM

## 2023-03-09 ENCOUNTER — Other Ambulatory Visit: Payer: Self-pay | Admitting: Nurse Practitioner

## 2023-03-09 DIAGNOSIS — F900 Attention-deficit hyperactivity disorder, predominantly inattentive type: Secondary | ICD-10-CM

## 2023-03-09 MED ORDER — AMPHETAMINE-DEXTROAMPHETAMINE 20 MG PO TABS
20.0000 mg | ORAL_TABLET | Freq: Every day | ORAL | 0 refills | Status: DC
Start: 2023-03-09 — End: 2023-04-27

## 2023-03-09 MED ORDER — AMPHETAMINE-DEXTROAMPHETAMINE 20 MG PO TABS: 20.0000 mg | ORAL_TABLET | Freq: Every day | ORAL | 0 refills | Status: DC

## 2023-03-09 NOTE — Telephone Encounter (Signed)
Is this okay to refill? 

## 2023-03-27 ENCOUNTER — Other Ambulatory Visit: Payer: Self-pay | Admitting: Nurse Practitioner

## 2023-03-27 DIAGNOSIS — F900 Attention-deficit hyperactivity disorder, predominantly inattentive type: Secondary | ICD-10-CM

## 2023-03-27 MED ORDER — AMPHETAMINE-DEXTROAMPHETAMINE 10 MG PO TABS
10.0000 mg | ORAL_TABLET | Freq: Every day | ORAL | 0 refills | Status: DC
Start: 2023-03-27 — End: 2023-04-27

## 2023-03-27 NOTE — Telephone Encounter (Signed)
Refill request last apt 09/03/22 will send pt. Message to schedule a future apt.

## 2023-04-27 ENCOUNTER — Other Ambulatory Visit: Payer: Self-pay | Admitting: Nurse Practitioner

## 2023-04-27 ENCOUNTER — Ambulatory Visit (INDEPENDENT_AMBULATORY_CARE_PROVIDER_SITE_OTHER): Payer: Commercial Managed Care - PPO | Admitting: Nurse Practitioner

## 2023-04-27 ENCOUNTER — Encounter: Payer: Self-pay | Admitting: Nurse Practitioner

## 2023-04-27 VITALS — BP 128/78 | HR 84 | Wt 155.8 lb

## 2023-04-27 DIAGNOSIS — E1169 Type 2 diabetes mellitus with other specified complication: Secondary | ICD-10-CM

## 2023-04-27 DIAGNOSIS — R030 Elevated blood-pressure reading, without diagnosis of hypertension: Secondary | ICD-10-CM

## 2023-04-27 DIAGNOSIS — I700 Atherosclerosis of aorta: Secondary | ICD-10-CM

## 2023-04-27 DIAGNOSIS — Z6830 Body mass index (BMI) 30.0-30.9, adult: Secondary | ICD-10-CM | POA: Diagnosis not present

## 2023-04-27 DIAGNOSIS — E1165 Type 2 diabetes mellitus with hyperglycemia: Secondary | ICD-10-CM

## 2023-04-27 DIAGNOSIS — K115 Sialolithiasis: Secondary | ICD-10-CM

## 2023-04-27 DIAGNOSIS — I708 Atherosclerosis of other arteries: Secondary | ICD-10-CM

## 2023-04-27 DIAGNOSIS — F900 Attention-deficit hyperactivity disorder, predominantly inattentive type: Secondary | ICD-10-CM

## 2023-04-27 DIAGNOSIS — R7989 Other specified abnormal findings of blood chemistry: Secondary | ICD-10-CM

## 2023-04-27 DIAGNOSIS — E782 Mixed hyperlipidemia: Secondary | ICD-10-CM

## 2023-04-27 MED ORDER — LISDEXAMFETAMINE DIMESYLATE 50 MG PO CAPS
50.0000 mg | ORAL_CAPSULE | Freq: Every day | ORAL | 0 refills | Status: DC
Start: 2023-06-26 — End: 2023-05-14

## 2023-04-27 MED ORDER — TIRZEPATIDE 2.5 MG/0.5ML ~~LOC~~ SOAJ
2.5000 mg | SUBCUTANEOUS | 0 refills | Status: DC
Start: 2023-04-27 — End: 2023-05-14

## 2023-04-27 MED ORDER — DOXYCYCLINE HYCLATE 100 MG PO TABS
100.0000 mg | ORAL_TABLET | Freq: Two times a day (BID) | ORAL | 0 refills | Status: DC
Start: 2023-04-27 — End: 2023-07-28

## 2023-04-27 MED ORDER — AMPHETAMINE-DEXTROAMPHETAMINE 20 MG PO TABS
20.0000 mg | ORAL_TABLET | Freq: Every day | ORAL | 0 refills | Status: DC
Start: 2023-05-27 — End: 2023-05-14

## 2023-04-27 MED ORDER — AMPHETAMINE-DEXTROAMPHETAMINE 20 MG PO TABS
20.0000 mg | ORAL_TABLET | Freq: Every day | ORAL | 0 refills | Status: DC
Start: 2023-06-26 — End: 2023-05-14

## 2023-04-27 MED ORDER — LISDEXAMFETAMINE DIMESYLATE 50 MG PO CAPS
50.0000 mg | ORAL_CAPSULE | Freq: Every day | ORAL | 0 refills | Status: DC
Start: 2023-04-27 — End: 2023-05-14

## 2023-04-27 MED ORDER — AMPHETAMINE-DEXTROAMPHETAMINE 20 MG PO TABS
20.0000 mg | ORAL_TABLET | Freq: Every day | ORAL | 0 refills | Status: DC
Start: 2023-04-27 — End: 2023-05-14

## 2023-04-27 MED ORDER — LISDEXAMFETAMINE DIMESYLATE 50 MG PO CAPS
50.0000 mg | ORAL_CAPSULE | Freq: Every day | ORAL | 0 refills | Status: DC
Start: 2023-05-27 — End: 2023-05-14

## 2023-04-27 NOTE — Progress Notes (Unsigned)
  Shawna Clamp, DNP, AGNP-c [facility]  [address] [place], [place] [address] [phone]  ESTABLISHED PATIENT- Chronic Health and/or Follow-Up Visit  There were no vitals taken for this visit.    Haley Charles is a 52 y.o. year old female presenting today for evaluation and management of chronic conditions. DM, HLD, BMI, ADHD, BP.   Intolerance to metformin in the past.   All ROS negative with exception of what is listed above.   PHYSICAL EXAM Physical Exam  PLAN Problem List Items Addressed This Visit   None   No follow-ups on file.   Shawna Clamp, DNP, AGNP-c

## 2023-04-27 NOTE — Patient Instructions (Addendum)
I will let you know if we need to make any changes to your plan of care based on your labs.   I recommend an annual eye exam for all patients with diabetes to evaluate for concerns that can present that can affect vision. Please have your eye doctor fax results of this exam to the office at [phone] so we can update your record.   I recommend the Shingles vaccine for all adults over 55. This helps to protect you against a shingles outbreak. You can get this done at any pharmacy.   I have changed your diabetes medication to mounjaro. We will see if we can get this covered well for you.   I have changed your ADHD medication to Vyvanse. We will see how this works for you.    Suck on sour things to help pull the fluid out of the salivary glands - I have sent in doxycycline for the salivary stones to see if this helps with the pain.

## 2023-04-28 DIAGNOSIS — R7989 Other specified abnormal findings of blood chemistry: Secondary | ICD-10-CM | POA: Insufficient documentation

## 2023-04-28 DIAGNOSIS — K115 Sialolithiasis: Secondary | ICD-10-CM | POA: Insufficient documentation

## 2023-04-28 LAB — COMPREHENSIVE METABOLIC PANEL
ALT: 25 IU/L (ref 0–32)
AST: 23 IU/L (ref 0–40)
Albumin/Globulin Ratio: 2 (ref 1.2–2.2)
Albumin: 4.7 g/dL (ref 3.8–4.9)
Alkaline Phosphatase: 92 IU/L (ref 44–121)
BUN/Creatinine Ratio: 11 (ref 9–23)
BUN: 7 mg/dL (ref 6–24)
Bilirubin Total: 0.5 mg/dL (ref 0.0–1.2)
CO2: 24 mmol/L (ref 20–29)
Calcium: 9.8 mg/dL (ref 8.7–10.2)
Chloride: 101 mmol/L (ref 96–106)
Creatinine, Ser: 0.65 mg/dL (ref 0.57–1.00)
Globulin, Total: 2.4 g/dL (ref 1.5–4.5)
Glucose: 151 mg/dL — ABNORMAL HIGH (ref 70–99)
Potassium: 4 mmol/L (ref 3.5–5.2)
Sodium: 139 mmol/L (ref 134–144)
Total Protein: 7.1 g/dL (ref 6.0–8.5)
eGFR: 107 mL/min/{1.73_m2} (ref >59)

## 2023-04-28 LAB — LIPID CASCADE
Cholesterol, Total: 231 mg/dL — ABNORMAL HIGH (ref 100–199)
HDL: 49 mg/dL (ref >39)
LDL Chol Calc (NIH): 150 mg/dL — ABNORMAL HIGH (ref 0–99)
LDL/HDL Ratio: 3.1 ratio (ref 0.0–3.2)
Total Non-HDL-Chol (LDL+VLDL): 182 mg/dL — ABNORMAL HIGH (ref 0–129)
Triglycerides: 177 mg/dL — ABNORMAL HIGH (ref 0–149)

## 2023-04-28 LAB — CBC WITH DIFFERENTIAL/PLATELET
Basophils Absolute: 0.1 10*3/uL (ref 0.0–0.2)
Basos: 1 %
EOS (ABSOLUTE): 0.1 10*3/uL (ref 0.0–0.4)
Eos: 1 %
Hematocrit: 41.6 % (ref 34.0–46.6)
Hemoglobin: 13 g/dL (ref 11.1–15.9)
Immature Grans (Abs): 0 10*3/uL (ref 0.0–0.1)
Immature Granulocytes: 0 %
Lymphocytes Absolute: 3.1 10*3/uL (ref 0.7–3.1)
Lymphs: 38 %
MCH: 28.3 pg (ref 26.6–33.0)
MCHC: 31.3 g/dL — ABNORMAL LOW (ref 31.5–35.7)
MCV: 91 fL (ref 79–97)
Monocytes Absolute: 0.4 10*3/uL (ref 0.1–0.9)
Monocytes: 5 %
Neutrophils Absolute: 4.5 10*3/uL (ref 1.4–7.0)
Neutrophils: 55 %
Platelets: 282 10*3/uL (ref 150–450)
RBC: 4.59 x10E6/uL (ref 3.77–5.28)
RDW: 11.4 % — ABNORMAL LOW (ref 11.7–15.4)
WBC: 8.3 10*3/uL (ref 3.4–10.8)

## 2023-04-28 LAB — HEMOGLOBIN A1C
Est. average glucose Bld gHb Est-mCnc: 137 mg/dL
Hgb A1c MFr Bld: 6.4 % — ABNORMAL HIGH (ref 4.8–5.6)

## 2023-04-28 LAB — T4, FREE: Free T4: 1.12 ng/dL (ref 0.82–1.77)

## 2023-04-28 LAB — TSH: TSH: 1.21 u[IU]/mL (ref 0.450–4.500)

## 2023-04-28 NOTE — Assessment & Plan Note (Signed)
ADHD is not well-controlled today with the use of short acting Adderall 20 mg in the morning and 10 mg in the afternoon.  Unfortunately it appears that the patient may be a rapid metabolizer which is leading to premature cessation of effectiveness of the medication.  We discussed options of changing to the long-acting formula versus a trial of Vyvanse to see if this is more effective for her.  At this time she has no preference.  Given her concerns with interaction with her fluoxetine joint decision made to switch to prodrug formulation of Adderall, Vyvanse. Plan: -I have changed her Adderall to Vyvanse.  This is a long-acting formulation of the medication and should be taken first thing in the morning. -I have continued an afternoon dose of Adderall but have increased this to 20 mg to see if this improves your afternoon effectiveness.  You can trial cutting the medication in half and taking each dose 2 hours apart if needed for improved control. -We will plan to follow-up in 3 months to see how you are doing on the new medication and make adjustments as needed.  Please reach out and let me know if this medication is not working well for you prior to that time.

## 2023-04-28 NOTE — Assessment & Plan Note (Signed)
Chronic type 2 diabetes with difficulty obtaining Ozempic as it is not covered well by patient's insurance.  We did discuss the option of switching to [facility].  She is open to this today.  We also discussed the importance of diet and exercise management and monitoring blood sugars to ensure good control. Plan: -Greggory Keen has been sent to the pharmacy.  If this is not well covered by your insurance please let me know immediately -We may be able to consider the patient assistance program with Novo Nordisk to see if you qualify for Ozempic directly from the manufacturer if Greggory Keen is not covered under your insurance for your diabetes.  Please look online at [facility] patient assistance to review the application and let me know if you would like to try this. -We will plan to follow-up in 6 months and see how you are doing.

## 2023-04-28 NOTE — Assessment & Plan Note (Signed)
History of low TSH noted on previous labs.  Will repeat labs today for evaluation

## 2023-04-28 NOTE — Assessment & Plan Note (Signed)
Mild swelling and tenderness noted to the salivary gland, primarily on the left-hand side.  Patient reports intermittent white drainage from the salivary ducts.  There is concern present for possible infection or salivary stone present.  We discussed options that may be helpful for management. Plan: -I have sent an order in for an antibiotic to assist with potential infection -I recommend using sugar-free sour candies multiple times a day to help encourage drainage from the salivary glands.  In the event there is a stone present this will help -We will plan to monitor for improvement.  If there is no improvement we will consider referral to ENT for further evaluation.

## 2023-04-28 NOTE — Assessment & Plan Note (Signed)
Chronic hyperlipidemia in the setting of type 2 diabetes and elevated BMI.  At this time the patient is not on statin therapy.  I do recommend low-dose statin therapy whether lipids are well-controlled or not cardiovascular protection in the setting of diabetes. Plan: -Labs pending today -Will consider addition of statin therapy based on findings and patient's tolerance.

## 2023-04-28 NOTE — Assessment & Plan Note (Signed)
Previous elevation in blood pressure which shows good control at this time.  No medication at this time. Plan: -Labs pending today -Will continue to monitor blood pressure and determine need for medication changes if blood pressure appears to consistently be elevated above 130/80.

## 2023-04-30 ENCOUNTER — Encounter: Payer: Self-pay | Admitting: Nurse Practitioner

## 2023-04-30 DIAGNOSIS — E1165 Type 2 diabetes mellitus with hyperglycemia: Secondary | ICD-10-CM

## 2023-04-30 DIAGNOSIS — F900 Attention-deficit hyperactivity disorder, predominantly inattentive type: Secondary | ICD-10-CM

## 2023-05-13 ENCOUNTER — Telehealth: Payer: Self-pay | Admitting: Nurse Practitioner

## 2023-05-13 ENCOUNTER — Other Ambulatory Visit: Payer: Self-pay | Admitting: Family Medicine

## 2023-05-13 DIAGNOSIS — E1169 Type 2 diabetes mellitus with other specified complication: Secondary | ICD-10-CM

## 2023-05-13 MED ORDER — ATORVASTATIN CALCIUM 20 MG PO TABS
20.0000 mg | ORAL_TABLET | Freq: Every day | ORAL | 3 refills | Status: DC
Start: 2023-05-13 — End: 2024-06-09

## 2023-05-13 NOTE — Telephone Encounter (Signed)
Atorvastatin sent for her to the pharmacy. She can start one to two days a week and increase by one dose a week every other week as she tolerates the medication. If she starts having pain, she can go back down by one day a week.

## 2023-05-13 NOTE — Telephone Encounter (Signed)
I am working with pt on her P.A for Mounjaro she asked about her cholesterol medication & is willing to start one, she did say that she tried Rosuvastatin in the past & it caused her joints to ache.  She wants to know if she try Atorvastatin instead & see if that doesn't cause the same issues?

## 2023-05-14 ENCOUNTER — Telehealth: Payer: Self-pay | Admitting: Nurse Practitioner

## 2023-05-14 DIAGNOSIS — F900 Attention-deficit hyperactivity disorder, predominantly inattentive type: Secondary | ICD-10-CM

## 2023-05-14 MED ORDER — AMPHETAMINE-DEXTROAMPHETAMINE 30 MG PO TABS
30.0000 mg | ORAL_TABLET | Freq: Every day | ORAL | 0 refills | Status: DC
Start: 2023-06-13 — End: 2023-07-28

## 2023-05-14 MED ORDER — AMPHETAMINE-DEXTROAMPHETAMINE 10 MG PO TABS
10.0000 mg | ORAL_TABLET | Freq: Every day | ORAL | 0 refills | Status: DC
Start: 2023-05-14 — End: 2023-07-28

## 2023-05-14 MED ORDER — AMPHETAMINE-DEXTROAMPHETAMINE 30 MG PO TABS
30.0000 mg | ORAL_TABLET | Freq: Every day | ORAL | 0 refills | Status: DC
Start: 2023-07-13 — End: 2023-07-28

## 2023-05-14 MED ORDER — AMPHETAMINE-DEXTROAMPHETAMINE 30 MG PO TABS
30.0000 mg | ORAL_TABLET | Freq: Every day | ORAL | 0 refills | Status: DC
Start: 2023-05-14 — End: 2023-07-28

## 2023-05-14 MED ORDER — METFORMIN HCL ER (OSM) 500 MG PO TB24
500.0000 mg | ORAL_TABLET | Freq: Every day | ORAL | 3 refills | Status: DC
Start: 2023-05-14 — End: 2024-02-26

## 2023-05-14 MED ORDER — AMPHETAMINE-DEXTROAMPHETAMINE 10 MG PO TABS
10.0000 mg | ORAL_TABLET | Freq: Every day | ORAL | 0 refills | Status: DC
Start: 2023-06-11 — End: 2023-07-28

## 2023-05-14 MED ORDER — AMPHETAMINE-DEXTROAMPHETAMINE 10 MG PO TABS
10.0000 mg | ORAL_TABLET | Freq: Every day | ORAL | 0 refills | Status: DC
Start: 2023-07-09 — End: 2023-07-28

## 2023-05-14 NOTE — Addendum Note (Signed)
Addended by: Artem Bunte, Huntley Dec E on: 05/14/2023 05:00 PM   Modules accepted: Orders

## 2023-05-14 NOTE — Telephone Encounter (Signed)
Pt called about her medication issues. I having been working on her PA's for [employer], but the issue is not PA it's her insurance only pays literally $12 towards ANY generic RX & only pays $46 towards any Brand medication, with a maximum benefit of $920 per year. Told her I'd never heard of a policy like this & recommended she try to get different insurance in the future if possible. So right now generic Vyvanse is not an option/available and she states the generic Adderall isn't lasting so she needs 20mg  in the am & 20mg  in the pm, so she would like a new Rx written for #60 & cancel the future other RX out.

## 2023-05-14 NOTE — Telephone Encounter (Signed)
Pt called about her medication issues. I having been working on her PA's for [employer], but the issue is not PA it's her insurance only pays literally $12 towards ANY generic RX & only pays $46 towards any Brand medication, with a maximum benefit of $920 per year. Told her I'd never heard of a policy like this & recommended she try to get different insurance in the future if possible. Suggested to patient that

## 2023-05-14 NOTE — Addendum Note (Signed)
Addended by: Cayden Granholm, Huntley Dec E on: 05/14/2023 04:56 PM   Modules accepted: Orders

## 2023-05-18 NOTE — Telephone Encounter (Signed)
Pt informed

## 2023-05-19 NOTE — Telephone Encounter (Signed)
Also suggested Pt Assistance for Ozempic,  there is not one for Vyvanse, she will come by the office

## 2023-07-28 ENCOUNTER — Encounter: Payer: Commercial Managed Care - PPO | Admitting: Medical

## 2023-07-28 ENCOUNTER — Encounter: Payer: Self-pay | Admitting: Nurse Practitioner

## 2023-07-28 ENCOUNTER — Ambulatory Visit (INDEPENDENT_AMBULATORY_CARE_PROVIDER_SITE_OTHER): Payer: Commercial Managed Care - PPO | Admitting: Nurse Practitioner

## 2023-07-28 VITALS — BP 120/82 | HR 88 | Wt 156.4 lb

## 2023-07-28 DIAGNOSIS — Z1231 Encounter for screening mammogram for malignant neoplasm of breast: Secondary | ICD-10-CM

## 2023-07-28 DIAGNOSIS — M791 Myalgia, unspecified site: Secondary | ICD-10-CM | POA: Diagnosis not present

## 2023-07-28 DIAGNOSIS — E1165 Type 2 diabetes mellitus with hyperglycemia: Secondary | ICD-10-CM

## 2023-07-28 DIAGNOSIS — F900 Attention-deficit hyperactivity disorder, predominantly inattentive type: Secondary | ICD-10-CM

## 2023-07-28 DIAGNOSIS — I708 Atherosclerosis of other arteries: Secondary | ICD-10-CM

## 2023-07-28 DIAGNOSIS — E1169 Type 2 diabetes mellitus with other specified complication: Secondary | ICD-10-CM

## 2023-07-28 DIAGNOSIS — R030 Elevated blood-pressure reading, without diagnosis of hypertension: Secondary | ICD-10-CM

## 2023-07-28 DIAGNOSIS — I700 Atherosclerosis of aorta: Secondary | ICD-10-CM

## 2023-07-28 DIAGNOSIS — E782 Mixed hyperlipidemia: Secondary | ICD-10-CM

## 2023-07-28 DIAGNOSIS — T466X5A Adverse effect of antihyperlipidemic and antiarteriosclerotic drugs, initial encounter: Secondary | ICD-10-CM

## 2023-07-28 MED ORDER — AMPHETAMINE-DEXTROAMPHETAMINE 10 MG PO TABS
10.0000 mg | ORAL_TABLET | Freq: Every day | ORAL | 0 refills | Status: DC
Start: 2023-08-09 — End: 2023-12-18

## 2023-07-28 MED ORDER — AMPHETAMINE-DEXTROAMPHETAMINE 30 MG PO TABS
30.0000 mg | ORAL_TABLET | Freq: Every day | ORAL | 0 refills | Status: DC
Start: 2023-09-05 — End: 2023-12-18

## 2023-07-28 MED ORDER — AMPHETAMINE-DEXTROAMPHETAMINE 10 MG PO TABS: 10.0000 mg | ORAL_TABLET | Freq: Every day | ORAL | 0 refills | Status: DC

## 2023-07-28 MED ORDER — AMPHETAMINE-DEXTROAMPHETAMINE 30 MG PO TABS
30.0000 mg | ORAL_TABLET | Freq: Every day | ORAL | 0 refills | Status: DC
Start: 2023-08-09 — End: 2023-11-13

## 2023-07-28 MED ORDER — AMPHETAMINE-DEXTROAMPHETAMINE 10 MG PO TABS
10.0000 mg | ORAL_TABLET | Freq: Every day | ORAL | 0 refills | Status: DC
Start: 2023-09-05 — End: 2023-11-13

## 2023-07-28 MED ORDER — FREESTYLE LIBRE 3 SENSOR MISC: 11 refills | Status: DC

## 2023-07-28 MED ORDER — AMPHETAMINE-DEXTROAMPHETAMINE 30 MG PO TABS
30.0000 mg | ORAL_TABLET | Freq: Every day | ORAL | 0 refills | Status: DC
Start: 2023-10-02 — End: 2023-12-18

## 2023-07-28 NOTE — Progress Notes (Signed)
Shawna Clamp, DNP, AGNP-c [facility]  [address] [place], [place] [address] [phone]  ESTABLISHED PATIENT- Chronic Health and/or Follow-Up Visit  Blood pressure 120/82, pulse 88, weight 156 lb 6.4 oz (70.9 kg).    Haley Charles is a 52 y.o. year old female presenting today for evaluation and management of chronic conditions.   DM She tells me that her blood sugars have not been well controlled on metformin. She has seen her blood sugars continually increase over the weeks despite working on low carbohydrate diet and exercise.She has has noticed weight gain. She has had increased hunger as well. She has not had any paresthesias  in her feet or hands.    She is unable to make a fist in her right hand when taking statin medication. She has held the medication for a week and the pain is improving. She plans to restart at one day a week when the pain resolves. If this is not effective, she will let me know.   All ROS negative with exception of what is listed above.   PHYSICAL EXAM Physical Exam Vitals and nursing note reviewed.  Constitutional:      Appearance: Normal appearance.  HENT:     Head: Normocephalic.  Eyes:     Pupils: Pupils are equal, round, and reactive to light.  Neck:     Vascular: No carotid bruit.  Cardiovascular:     Rate and Rhythm: Normal rate and regular rhythm.     Pulses: Normal pulses.     Heart sounds: Normal heart sounds.  Pulmonary:     Effort: Pulmonary effort is normal.     Breath sounds: Normal breath sounds.  Abdominal:     General: Bowel sounds are normal.     Palpations: Abdomen is soft.  Musculoskeletal:        General: Normal range of motion.     Cervical back: Normal range of motion.  Skin:    General: Skin is warm.  Neurological:     General: No focal deficit present.     Mental Status: She is alert and oriented to person, place, and time.  Psychiatric:        Mood and Affect: Mood normal.      PLAN Problem List Items Addressed This Visit     Elevated BP without diagnosis of hypertension    BP stable today. Continue to work on diet and exercise. We will monitor.       Relevant Medications   Continuous Glucose Sensor (FREESTYLE LIBRE 3 SENSOR) MISC   Other Relevant Orders   CMP14+EGFR (Completed)   Hemoglobin A1c (Completed)   Lipid panel (Completed)   Microalbumin/Creatinine Ratio, Urine (Completed)   Type 2 diabetes mellitus with hyperglycemia, without long-term current use of insulin (HCC)    Chronic. Current management with metformin with difficulty controlling blood sugars. Insurance not covering GLP-1 medications well, therefore cost is a prohibitive factor despite excellent control while on these. We will plan to continue with diet, exercise and metformin at this time. She may consider application for ozempic patient assistance to see if she qualifies for this. Labs pending.       Relevant Medications   Continuous Glucose Sensor (FREESTYLE LIBRE 3 SENSOR) MISC   Other Relevant Orders   CMP14+EGFR (Completed)   Hemoglobin A1c (Completed)   Lipid panel (Completed)   Microalbumin/Creatinine Ratio, Urine (Completed)   Combined hyperlipidemia associated with type 2 diabetes mellitus (HCC)    Elevated lipids in the setting  of DM, HTN, and elevated BMI. She has been on a statin, but the side effects have been significant with limitations to muscle use. She has held this for now and will plan to restart at 1 day a week to see if she can tolerate this. We will monitor.       Relevant Medications   Continuous Glucose Sensor (FREESTYLE LIBRE 3 SENSOR) MISC   Other Relevant Orders   CMP14+EGFR (Completed)   Hemoglobin A1c (Completed)   Lipid panel (Completed)   Microalbumin/Creatinine Ratio, Urine (Completed)   Attention deficit hyperactivity disorder (ADHD), predominantly inattentive type - Primary    Chronic. Managed with adderall with no alarm symptoms. Refills  provided. 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  Time: 39 minutes, >50% spent counseling, care coordination, chart review, and documentation.   Shawna Clamp, DNP, AGNP-c

## 2023-07-29 LAB — MICROALBUMIN / CREATININE URINE RATIO
Creatinine, Urine: 130.5 mg/dL
Microalb/Creat Ratio: 7 mg/g{creat} (ref 0–29)
Microalbumin, Urine: 9.1 ug/mL

## 2023-07-29 LAB — HEMOGLOBIN A1C
Est. average glucose Bld gHb Est-mCnc: 151 mg/dL
Hgb A1c MFr Bld: 6.9 % — ABNORMAL HIGH (ref 4.8–5.6)

## 2023-07-29 LAB — CMP14+EGFR
ALT: 40 IU/L — ABNORMAL HIGH (ref 0–32)
AST: 32 IU/L (ref 0–40)
Albumin: 4.5 g/dL (ref 3.8–4.9)
Alkaline Phosphatase: 91 IU/L (ref 44–121)
BUN/Creatinine Ratio: 21 (ref 9–23)
BUN: 14 mg/dL (ref 6–24)
Bilirubin Total: 0.5 mg/dL (ref 0.0–1.2)
CO2: 26 mmol/L (ref 20–29)
Calcium: 9.6 mg/dL (ref 8.7–10.2)
Chloride: 101 mmol/L (ref 96–106)
Creatinine, Ser: 0.67 mg/dL (ref 0.57–1.00)
Globulin, Total: 2.5 g/dL (ref 1.5–4.5)
Glucose: 129 mg/dL — ABNORMAL HIGH (ref 70–99)
Potassium: 4.6 mmol/L (ref 3.5–5.2)
Sodium: 139 mmol/L (ref 134–144)
Total Protein: 7 g/dL (ref 6.0–8.5)
eGFR: 106 mL/min/{1.73_m2} (ref >59)

## 2023-07-29 LAB — LIPID PANEL
Chol/HDL Ratio: 4 ratio (ref 0.0–4.4)
Cholesterol, Total: 190 mg/dL (ref 100–199)
HDL: 47 mg/dL (ref >39)
LDL Chol Calc (NIH): 120 mg/dL — ABNORMAL HIGH (ref 0–99)
Triglycerides: 129 mg/dL (ref 0–149)
VLDL Cholesterol Cal: 23 mg/dL (ref 5–40)

## 2023-07-30 ENCOUNTER — Ambulatory Visit
Admission: RE | Admit: 2023-07-30 | Discharge: 2023-07-30 | Disposition: A | Discharge status: Home / Self Care | Payer: Commercial Managed Care - PPO | Source: Ambulatory Visit | Attending: Nurse Practitioner | Admitting: Nurse Practitioner

## 2023-07-30 DIAGNOSIS — Z1231 Encounter for screening mammogram for malignant neoplasm of breast: Secondary | ICD-10-CM

## 2023-08-05 NOTE — Assessment & Plan Note (Signed)
Chronic. Managed with adderall with no alarm symptoms. Refills provided. OK to f/u in 6 months

## 2023-08-05 NOTE — Assessment & Plan Note (Signed)
Chronic. Current management with metformin with difficulty controlling blood sugars. Insurance not covering GLP-1 medications well, therefore cost is a prohibitive factor despite excellent control while on these. We will plan to continue with diet, exercise and metformin at this time. She may consider application for ozempic patient assistance to see if she qualifies for this. Labs pending.

## 2023-08-05 NOTE — Assessment & Plan Note (Signed)
BP stable today. Continue to work on diet and exercise. We will monitor.

## 2023-08-05 NOTE — Assessment & Plan Note (Signed)
Elevated lipids in the setting of DM, HTN, and elevated BMI. She has been on a statin, but the side effects have been significant with limitations to muscle use. She has held this for now and will plan to restart at 1 day a week to see if she can tolerate this. We will monitor.

## 2023-08-07 ENCOUNTER — Encounter: Payer: Self-pay | Admitting: Nurse Practitioner

## 2023-08-10 ENCOUNTER — Other Ambulatory Visit (HOSPITAL_BASED_OUTPATIENT_CLINIC_OR_DEPARTMENT_OTHER): Payer: Self-pay | Admitting: Nurse Practitioner

## 2023-08-10 DIAGNOSIS — N951 Menopausal and female climacteric states: Secondary | ICD-10-CM

## 2023-08-17 ENCOUNTER — Telehealth: Payer: Self-pay | Admitting: Nurse Practitioner

## 2023-08-17 NOTE — Telephone Encounter (Signed)
PT ASSISTANCE OZEMPIC application completed

## 2023-08-19 ENCOUNTER — Other Ambulatory Visit: Payer: Self-pay

## 2023-08-19 ENCOUNTER — Telehealth: Payer: Self-pay | Admitting: Nurse Practitioner

## 2023-08-19 MED ORDER — FREESTYLE LIBRE 3 PLUS SENSOR MISC: 1.0000 | 5 refills | Status: DC

## 2023-08-19 NOTE — Telephone Encounter (Signed)
Please switch pt to Freestyle Libre 3 Plus to [facility], pt picked up sample while she was here

## 2023-09-01 NOTE — Telephone Encounter (Signed)
Thank you :)

## 2023-09-01 NOTE — Telephone Encounter (Signed)
Pt assistance denied because pt has commercial ins.  Will send this pt to pharmacist for suggestions.

## 2023-09-01 NOTE — Telephone Encounter (Signed)
Haley Maxwell,  do you have any suggestions for this patient, she has a really bad insurance that only pays $46 towards brand meds & $12 towards generic with a maximum benefit of $920 per year.  Denied for Pt Assistance

## 2023-09-01 NOTE — Telephone Encounter (Signed)
Contacted patient. Unfortunately, the commercial copay card only provides an additional $150 off the price and confirmed with patient this would not be a affordable.  No other assistance is available at this time for the GLP-1 injections for commercial patients.  Patient is planning on pursuing new insurance for 2025. Is wondering if we have more samples of ozempic that we can provide while she searches for better insurance coverage? States her sugars are responding well to the ozempic and would hate to stop.

## 2023-09-08 ENCOUNTER — Other Ambulatory Visit (HOSPITAL_BASED_OUTPATIENT_CLINIC_OR_DEPARTMENT_OTHER): Payer: Self-pay | Admitting: Nurse Practitioner

## 2023-09-08 DIAGNOSIS — F5105 Insomnia due to other mental disorder: Secondary | ICD-10-CM

## 2023-09-08 DIAGNOSIS — F419 Anxiety disorder, unspecified: Secondary | ICD-10-CM

## 2023-09-08 NOTE — Telephone Encounter (Signed)
Last apt 07/28/23

## 2023-09-12 NOTE — Telephone Encounter (Signed)
Gave pt #2 Ozempic samples

## 2023-10-02 ENCOUNTER — Encounter: Payer: Self-pay | Admitting: Nurse Practitioner

## 2023-10-02 DIAGNOSIS — M79641 Pain in right hand: Secondary | ICD-10-CM

## 2023-11-04 NOTE — Addendum Note (Signed)
Addended by: Eulas Schweitzer, Huntley Dec E on: 11/04/2023 03:56 PM   Modules accepted: Orders

## 2023-11-13 ENCOUNTER — Other Ambulatory Visit: Payer: Self-pay | Admitting: Nurse Practitioner

## 2023-11-13 DIAGNOSIS — F900 Attention-deficit hyperactivity disorder, predominantly inattentive type: Secondary | ICD-10-CM

## 2023-11-13 MED ORDER — AMPHETAMINE-DEXTROAMPHETAMINE 30 MG PO TABS: 30.0000 mg | ORAL_TABLET | Freq: Every day | ORAL | 0 refills | Status: DC

## 2023-11-13 MED ORDER — AMPHETAMINE-DEXTROAMPHETAMINE 10 MG PO TABS: 10.0000 mg | ORAL_TABLET | Freq: Every day | ORAL | 0 refills | Status: DC

## 2023-11-16 ENCOUNTER — Ambulatory Visit
Admission: RE | Admit: 2023-11-16 | Discharge: 2023-11-16 | Disposition: A | Discharge status: Home / Self Care | Payer: Commercial Managed Care - PPO | Source: Ambulatory Visit | Attending: Nurse Practitioner

## 2023-11-16 DIAGNOSIS — M79641 Pain in right hand: Secondary | ICD-10-CM

## 2023-11-23 ENCOUNTER — Encounter: Payer: Self-pay | Admitting: Nurse Practitioner

## 2023-11-24 ENCOUNTER — Other Ambulatory Visit: Payer: Self-pay

## 2023-11-24 DIAGNOSIS — M25549 Pain in joints of unspecified hand: Secondary | ICD-10-CM

## 2023-11-24 DIAGNOSIS — M79641 Pain in right hand: Secondary | ICD-10-CM

## 2023-11-26 ENCOUNTER — Encounter: Payer: Self-pay | Admitting: Nurse Practitioner

## 2023-11-26 DIAGNOSIS — Z6830 Body mass index (BMI) 30.0-30.9, adult: Secondary | ICD-10-CM

## 2023-11-26 DIAGNOSIS — E1165 Type 2 diabetes mellitus with hyperglycemia: Secondary | ICD-10-CM

## 2023-11-26 DIAGNOSIS — E1169 Type 2 diabetes mellitus with other specified complication: Secondary | ICD-10-CM

## 2023-11-26 DIAGNOSIS — R030 Elevated blood-pressure reading, without diagnosis of hypertension: Secondary | ICD-10-CM

## 2023-11-26 DIAGNOSIS — I708 Atherosclerosis of other arteries: Secondary | ICD-10-CM

## 2023-12-07 ENCOUNTER — Encounter: Payer: Self-pay | Admitting: Orthopaedic Surgery

## 2023-12-07 ENCOUNTER — Ambulatory Visit: Payer: Commercial Managed Care - PPO | Admitting: Orthopaedic Surgery

## 2023-12-07 DIAGNOSIS — M79641 Pain in right hand: Secondary | ICD-10-CM | POA: Diagnosis not present

## 2023-12-07 MED ORDER — METHYLPREDNISOLONE ACETATE 40 MG/ML IJ SUSP
13.3300 mg | INTRAMUSCULAR | Status: AC | PRN
  Administered 2023-12-07: 13.33 mg

## 2023-12-07 MED ORDER — BUPIVACAINE HCL 0.5 % IJ SOLN
0.3300 mL | INTRAMUSCULAR | Status: AC | PRN
  Administered 2023-12-07: .33 mL

## 2023-12-07 MED ORDER — LIDOCAINE HCL 1 % IJ SOLN
0.3000 mL | INTRAMUSCULAR | Status: AC | PRN
  Administered 2023-12-07: .3 mL

## 2023-12-07 NOTE — Progress Notes (Signed)
Office Visit Note   Patient: Haley Charles           Date of Birth: 1971-12-05           MRN: [id number] Visit Date: 12/07/2023              Requested by: Tollie Eth, NP [address] [place],  [place] [address] PCP: Tollie Eth, NP   Assessment & Plan: Visit Diagnoses:  1. Pain in right hand     Plan: Impression is right hand long finger trigger finger and right hand ring finger stenosing tenosynovitis.  We have discussed proceeding with right long and ring finger trigger finger injections today in addition to night splinting for the next week.  She is agreeable to this plan.  She will follow-up with [place] for her left hand over the next few weeks.  Call with concerns or questions.  Follow-Up Instructions: Return if symptoms worsen or fail to improve.   Orders:  Orders Placed This Encounter  Procedures   Hand/UE Inj   Hand/UE Inj   No orders of the defined types were placed in this encounter.     Procedures: Hand/UE Inj: R long A1 for trigger finger on 12/07/2023 12:37 PM Indications: pain Details: 25 G needle Medications: 0.3 mL lidocaine 1 %; 0.33 mL bupivacaine 0.5 %; 13.33 mg methylPREDNISolone acetate 40 MG/ML Outcome: tolerated well, no immediate complications Consent was given by the patient. Patient was prepped and draped in the usual sterile fashion.    Hand/UE Inj: R ring A1 for trigger finger on 12/07/2023 12:38 PM Indications: pain Details: 25 G needle Medications: 0.3 mL lidocaine 1 %; 0.33 mL bupivacaine 0.5 %; 13.33 mg methylPREDNISolone acetate 40 MG/ML Outcome: tolerated well, no immediate complications Consent was given by the patient. Patient was prepped and draped in the usual sterile fashion.       Clinical Data: No additional findings.   Subjective: Chief Complaint  Patient presents with   Right Hand - Pain    Middle and Ring Finger    HPI patient is a pleasant 52 year old right-hand-dominant female who comes in today with  right long trigger finger as well as pain and stiffness to the right ring finger.  In regards to the trigger finger, she has had the symptoms for a while.  No previous injection.  Her main issue today though is the right ring finger.  Symptoms have been ongoing for about 2 months.  No injury or change in activity.  She complains mostly of stiffness and pain worse when trying to flex the MCP and PIP joints.  Symptoms also appear to be worse at night when she is sleeping.  She has been using a device that massages her hands which seems to help the pain while using it but still unable to make a full fist.  She denies any paresthesias to either hand.  She does have a history of type 2 diabetes.    Review of Systems as detailed in HPI.  All others reviewed and are negative.   Objective: Vital Signs: There were no vitals taken for this visit.  Physical Exam well-developed well-nourished female in no acute distress.  Alert and oriented x 3.  Ortho Exam Examination of the right hand long finger: Mild tenderness to the A1 pulley.  She does have reproducible triggering.  Examination of the right ring finger.  She does have significant pain as well as a palpable nodule at the A1 pulley.  No  reproducible triggering although she does have stiffness when trying to flex the MCP and PIP joints.  She has tenderness to the dorsal aspect of the proximal phalanx and into the PIP joint.  Collaterals are stable.  She is neurovascularly intact distally.  Specialty Comments:  No specialty comments available.  Imaging: No new imaging   PMFS History: Patient Active Problem List   Diagnosis Date Noted   Stone of salivary gland or duct 04/28/2023   Low TSH level 04/28/2023   Attention deficit hyperactivity disorder (ADHD), predominantly inattentive type 08/13/2022   Arthralgia of both knees 12/25/2021   Combined hyperlipidemia associated with type 2 diabetes mellitus (HCC) 06/12/2021   Type 2 diabetes mellitus with  hyperglycemia, without long-term current use of insulin (HCC) 03/11/2021   Anxiety and depression 03/04/2021   Insomnia due to anxiety and fear 03/04/2021   Mixed stress and urge urinary incontinence 03/04/2021   Vaginal dryness, menopausal 03/04/2021   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) 03/04/2021   Elevated BP without diagnosis of hypertension 03/04/2021   BMI 30.0-30.9,adult 03/04/2021   Past Medical History:  Diagnosis Date   Acute flank pain 06/18/2018   Annual physical exam 03/11/2021   Anxiety    Colon polyp    Depression    Diverticulosis    Encounter for annual routine gynecological examination 03/11/2021   Encounter to establish care 03/04/2021   Enteritis    History of in colonoscopy 2006   Flank pain 06/18/2018   GERD (gastroesophageal reflux disease)    History of kidney stones    Laboratory examination ordered as part of a routine general medical examination 03/04/2021   Mixed hyperlipidemia 03/11/2021   Peripheral vascular disease (HCC)    right leg   PONV (postoperative nausea and vomiting)    Pyelonephritis 06/23/2018   Renal calculus, right 06/21/2018   Spasm of thoracic back muscle 03/04/2021   Weight gain 03/04/2021    Family History  Problem Relation Age of Onset   Colon polyps Mother    Esophagitis Mother    Esophageal cancer Mother    Cancer Mother    Stroke Mother    CAD Mother    CAD Father    Hypertension Father    Diabetes Mellitus II Father    Lung cancer Father    Cancer Father    Rectal cancer Neg Hx    Stomach cancer Neg Hx    Colon cancer Neg Hx    Breast cancer Neg Hx     Past Surgical History:  Procedure Laterality Date   APPENDECTOMY     COLONOSCOPY  04/16/2018   IR URETERAL STENT RIGHT NEW ACCESS W/O SEP NEPHROSTOMY CATH  06/18/2018   NEPHROLITHOTOMY Right 06/21/2018   Procedure: RIGHT NEPHROLITHOTOMY PERCUTANEOUS;  Surgeon: Ihor Gully, MD;  Location: [facility];  Service: Urology;  Laterality: Right;    TUBAL LIGATION  21 years ago   UPPER GI ENDOSCOPY     Social History   Occupational History   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

## 2023-12-15 MED ORDER — SEMAGLUTIDE (2 MG/DOSE) 8 MG/3ML ~~LOC~~ SOPN: 2.0000 mg | PEN_INJECTOR | SUBCUTANEOUS | 1 refills | Status: DC

## 2023-12-16 ENCOUNTER — Encounter: Payer: Self-pay | Admitting: Nurse Practitioner

## 2023-12-16 DIAGNOSIS — F900 Attention-deficit hyperactivity disorder, predominantly inattentive type: Secondary | ICD-10-CM

## 2023-12-18 ENCOUNTER — Other Ambulatory Visit: Payer: Self-pay | Admitting: Nurse Practitioner

## 2023-12-18 DIAGNOSIS — F900 Attention-deficit hyperactivity disorder, predominantly inattentive type: Secondary | ICD-10-CM

## 2023-12-18 MED ORDER — AMPHETAMINE-DEXTROAMPHETAMINE 10 MG PO TABS: 10.0000 mg | ORAL_TABLET | Freq: Every day | ORAL | 0 refills | Status: DC

## 2023-12-18 MED ORDER — AMPHETAMINE-DEXTROAMPHETAMINE 30 MG PO TABS
30.0000 mg | ORAL_TABLET | Freq: Every day | ORAL | 0 refills | Status: DC
Start: 2023-12-18 — End: 2024-02-26

## 2023-12-18 MED ORDER — AMPHETAMINE-DEXTROAMPHETAMINE 30 MG PO TABS: 30.0000 mg | ORAL_TABLET | Freq: Every day | ORAL | 0 refills | Status: DC

## 2023-12-18 NOTE — Telephone Encounter (Signed)
 Request already managed and refills provided with earlier encounter.

## 2024-02-04 ENCOUNTER — Ambulatory Visit: Payer: Commercial Managed Care - PPO | Admitting: Orthopaedic Surgery

## 2024-02-04 ENCOUNTER — Encounter: Payer: Self-pay | Admitting: Orthopaedic Surgery

## 2024-02-04 DIAGNOSIS — M65322 Trigger finger, left index finger: Secondary | ICD-10-CM | POA: Diagnosis not present

## 2024-02-04 DIAGNOSIS — M65321 Trigger finger, right index finger: Secondary | ICD-10-CM | POA: Diagnosis not present

## 2024-02-04 DIAGNOSIS — M65342 Trigger finger, left ring finger: Secondary | ICD-10-CM

## 2024-02-04 MED ORDER — BUPIVACAINE HCL 0.5 % IJ SOLN
0.3300 mL | INTRAMUSCULAR | Status: AC | PRN
  Administered 2024-02-04: .33 mL

## 2024-02-04 MED ORDER — LIDOCAINE HCL 1 % IJ SOLN
0.3000 mL | INTRAMUSCULAR | Status: AC | PRN
  Administered 2024-02-04: .3 mL

## 2024-02-04 MED ORDER — METHYLPREDNISOLONE ACETATE 40 MG/ML IJ SUSP
13.3300 mg | INTRAMUSCULAR | Status: AC | PRN
Start: 2024-02-04 — End: 2024-02-04
  Administered 2024-02-04: 13.33 mg

## 2024-02-04 NOTE — Progress Notes (Signed)
 Office Visit Note   Patient: Haley Charles           Date of Birth: 10/19/1971           MRN: [id number] Visit Date: 02/04/2024              Requested by:  Tollie Eth, NP [address] [place],  [place] [address]  PCP: Tollie Eth, NP   Assessment & Plan: Visit Diagnoses:  1. Trigger index finger of right hand   2. Trigger index finger of left hand   3. Trigger ring finger of left hand     Plan: Patient is a 53 year old female with bilateral index trigger fingers and left ring trigger fingers.  All 3 were injected today.  She tolerated these well.  Will see her back as needed.  Follow-Up Instructions: No follow-ups on file.   Orders:  No orders of the defined types were placed in this encounter.  No orders of the defined types were placed in this encounter.     Procedures: Hand/UE Inj: bilateral index A1 for trigger finger on 02/04/2024 8:38 AM Indications: pain Details: 25 G needle Outcome: tolerated well, no immediate complications Consent was given by the patient. Patient was prepped and draped in the usual sterile fashion.    Hand/UE Inj: L ring A1 for trigger finger on 02/04/2024 8:38 AM Indications: pain Details: 25 G needle Medications: 0.3 mL lidocaine 1 %; 0.33 mL bupivacaine 0.5 %; 13.33 mg methylPREDNISolone acetate 40 MG/ML Outcome: tolerated well, no immediate complications Consent was given by the patient. Patient was prepped and draped in the usual sterile fashion.       Clinical Data: No additional findings.   Subjective: Chief Complaint  Patient presents with   Right Hand - Pain   Left Hand - Pain    HPI Patient comes in today for evaluation of trigger fingers in both hands.  She underwent trigger finger injections in the right hand for the middle and ring fingers in December.  She has done well from these injections. Review of Systems   Objective: Vital Signs: There were no vitals taken for this visit.  Physical  Exam  Ortho Exam Examination of bilateral hands show tender nodules along the flexor tendon sheath near the A1 pulley of the left index, left ring, right index fingers. Specialty Comments:  No specialty comments available.  Imaging: No results found.   PMFS History: Patient Active Problem List   Diagnosis Date Noted   Stone of salivary gland or duct 04/28/2023   Low TSH level 04/28/2023   Attention deficit hyperactivity disorder (ADHD), predominantly inattentive type 08/13/2022   Arthralgia of both knees 12/25/2021   Combined hyperlipidemia associated with type 2 diabetes mellitus (HCC) 06/12/2021   Type 2 diabetes mellitus with hyperglycemia, without long-term current use of insulin (HCC) 03/11/2021   Anxiety and depression 03/04/2021   Insomnia due to anxiety and fear 03/04/2021   Mixed stress and urge urinary incontinence 03/04/2021   Vaginal dryness, menopausal 03/04/2021   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) 03/04/2021   Elevated BP without diagnosis of hypertension 03/04/2021   BMI 30.0-30.9,adult 03/04/2021   Past Medical History:  Diagnosis Date   Acute flank pain 06/18/2018   Annual physical exam 03/11/2021   Anxiety    Colon polyp    Depression    Diverticulosis    Encounter for annual routine gynecological examination 03/11/2021   Encounter to establish care 03/04/2021  Enteritis    History of in colonoscopy 2006   Flank pain 06/18/2018   GERD (gastroesophageal reflux disease)    History of kidney stones    Laboratory examination ordered as part of a routine general medical examination 03/04/2021   Mixed hyperlipidemia 03/11/2021   Peripheral vascular disease (HCC)    right leg   PONV (postoperative nausea and vomiting)    Pyelonephritis 06/23/2018   Renal calculus, right 06/21/2018   Spasm of thoracic back muscle 03/04/2021   Weight gain 03/04/2021    Family History  Problem Relation Age of Onset   Colon polyps Mother     Esophagitis Mother    Esophageal cancer Mother    Cancer Mother    Stroke Mother    CAD Mother    CAD Father    Hypertension Father    Diabetes Mellitus II Father    Lung cancer Father    Cancer Father    Rectal cancer Neg Hx    Stomach cancer Neg Hx    Colon cancer Neg Hx    Breast cancer Neg Hx     Past Surgical History:  Procedure Laterality Date   APPENDECTOMY     COLONOSCOPY  04/16/2018   IR URETERAL STENT RIGHT NEW ACCESS W/O SEP NEPHROSTOMY CATH  06/18/2018   NEPHROLITHOTOMY Right 06/21/2018   Procedure: RIGHT NEPHROLITHOTOMY PERCUTANEOUS;  Surgeon: Ihor Gully, MD;  Location: [facility];  Service: Urology;  Laterality: Right;   TUBAL LIGATION  21 years ago   UPPER GI ENDOSCOPY     Social History   Occupational History   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

## 2024-02-08 ENCOUNTER — Encounter: Payer: Self-pay | Admitting: Nurse Practitioner

## 2024-02-14 ENCOUNTER — Other Ambulatory Visit (HOSPITAL_BASED_OUTPATIENT_CLINIC_OR_DEPARTMENT_OTHER): Payer: Self-pay | Admitting: Nurse Practitioner

## 2024-02-14 DIAGNOSIS — F419 Anxiety disorder, unspecified: Secondary | ICD-10-CM

## 2024-02-15 ENCOUNTER — Encounter: Payer: Self-pay | Admitting: Internal Medicine

## 2024-02-26 ENCOUNTER — Ambulatory Visit (INDEPENDENT_AMBULATORY_CARE_PROVIDER_SITE_OTHER): Payer: Self-pay | Admitting: Nurse Practitioner

## 2024-02-26 VITALS — BP 132/86 | HR 87 | Wt 157.0 lb

## 2024-02-26 DIAGNOSIS — M6530 Trigger finger, unspecified finger: Secondary | ICD-10-CM

## 2024-02-26 DIAGNOSIS — E559 Vitamin D deficiency, unspecified: Secondary | ICD-10-CM

## 2024-02-26 DIAGNOSIS — R7989 Other specified abnormal findings of blood chemistry: Secondary | ICD-10-CM

## 2024-02-26 DIAGNOSIS — R030 Elevated blood-pressure reading, without diagnosis of hypertension: Secondary | ICD-10-CM

## 2024-02-26 DIAGNOSIS — F32A Depression, unspecified: Secondary | ICD-10-CM

## 2024-02-26 DIAGNOSIS — Z6830 Body mass index (BMI) 30.0-30.9, adult: Secondary | ICD-10-CM

## 2024-02-26 DIAGNOSIS — E1169 Type 2 diabetes mellitus with other specified complication: Secondary | ICD-10-CM

## 2024-02-26 DIAGNOSIS — E1165 Type 2 diabetes mellitus with hyperglycemia: Secondary | ICD-10-CM

## 2024-02-26 DIAGNOSIS — F409 Phobic anxiety disorder, unspecified: Secondary | ICD-10-CM

## 2024-02-26 DIAGNOSIS — F5105 Insomnia due to other mental disorder: Secondary | ICD-10-CM

## 2024-02-26 DIAGNOSIS — Z6835 Body mass index (BMI) 35.0-35.9, adult: Secondary | ICD-10-CM

## 2024-02-26 DIAGNOSIS — F900 Attention-deficit hyperactivity disorder, predominantly inattentive type: Secondary | ICD-10-CM

## 2024-02-26 DIAGNOSIS — E782 Mixed hyperlipidemia: Secondary | ICD-10-CM

## 2024-02-26 DIAGNOSIS — F419 Anxiety disorder, unspecified: Secondary | ICD-10-CM

## 2024-02-26 MED ORDER — AMPHETAMINE-DEXTROAMPHETAMINE 30 MG PO TABS: 30.0000 mg | ORAL_TABLET | Freq: Every day | ORAL | 0 refills | Status: DC

## 2024-02-26 MED ORDER — AMPHETAMINE-DEXTROAMPHETAMINE 10 MG PO TABS: 10.0000 mg | ORAL_TABLET | Freq: Every day | ORAL | 0 refills | Status: DC

## 2024-02-26 MED ORDER — FLUOXETINE HCL 20 MG PO CAPS: ORAL_CAPSULE | ORAL | 3 refills | Status: AC

## 2024-02-26 MED ORDER — TRAZODONE HCL 50 MG PO TABS: 25.0000 mg | ORAL_TABLET | Freq: Every evening | ORAL | 3 refills | Status: AC | PRN

## 2024-02-26 MED ORDER — DICLOFENAC SODIUM 75 MG PO TBEC: 75.0000 mg | DELAYED_RELEASE_TABLET | Freq: Two times a day (BID) | ORAL | 3 refills | Status: DC

## 2024-02-26 MED ORDER — KETOROLAC TROMETHAMINE 60 MG/2ML IM SOLN
60.0000 mg | Freq: Once | INTRAMUSCULAR | Status: AC
  Administered 2024-02-26: 60 mg via INTRAMUSCULAR

## 2024-02-26 NOTE — Progress Notes (Signed)
 Shawna Clamp, DNP, AGNP-c [facility]  [address] [place], [place] [address] [phone]  ESTABLISHED PATIENT- Chronic Health and/or Follow-Up Visit  Blood pressure 132/86, pulse 87, weight 157 lb (71.2 kg).    Haley Charles is a 53 y.o. year old female presenting today for evaluation and management of chronic conditions.   History of Present Illness Haley Charles is a 53 year old female with trigger fingers who presents with inflammation and pain in both hands.  She experiences trigger fingers in both hands, leading to inflammation and pain. Previously, she was on meloxicam for inflammation, but it was discontinued due to limited effectiveness. The medication was initially prescribed for knee issues. She has received injections for her trigger fingers, with the first one administered in January. She describes the injections as painful, feeling as though her fingers are going to explode. Despite the injections, symptoms have returned, with her fingers getting caught and feeling bruised.  She has a history of diabetes, which is a concern when receiving injections due to potential blood sugar spikes. She prefers short-term spikes over prolonged pain. She uses a [employer] sensor to monitor her blood sugar levels, which sometimes causes itching and soreness, and occasionally gives inaccurate low readings, especially at night. She checks her blood sugar with a finger stick to confirm the readings. There is no significant family history of trigger fingers, although her father experienced occasional finger locking.  She is on Ozempic for weight management, which initially caused severe acid reflux and belching when the dosage was increased. However, these symptoms have since resolved. She is not currently taking metformin.  She experiences sleep disturbances and occasionally uses hydroxyzine, which causes vivid dreams. She has tried taking half a dose to mitigate the dreams,  but they persist.  She is actively trying to maintain her health through exercise, using a vibration plate and other equipment to stay in shape. She emphasizes the importance of staying healthy to be able to play with her grandchildren. There is a family history of cancer, Charcot-Marie-Tooth disease, and heart disease on both sides of her family.  All ROS negative with exception of what is listed above.   PHYSICAL EXAM Physical Exam MEASUREMENTS: Height- 57.0. HENT: PERRL, conjunctiva normal. Normocephalic. TM normal bilaterally. Nasal passages normal. Mouth moist and clear. CV: Heart rate and rhythm normal. S1 and S2 auscultated with no extrasystoles. No murmur or rubs present. Radial and pedal pulses intact and regular. No carotid bruit. No edema in extremities. Capillary refill 2 to 3 seconds. PULM: Respirations even and unlabored. Lungs clear bilaterally in all fields. No wheezing. NECK: No cervical adenopathy present. Thyroid normal. GI: Soft. Bowel sounds normal and present in all 4 quadrants. No distention, tenderness, guarding, or rebound. MSK: ROM decreased in her hands bilaterally. Fingers and palms are swollen and tender. SKIN: Warm, dry, intact. No rashes or concerning lesions present. NEURO: Alert and oriented x 4. No weakness or sensory deficits. Coordination and gait intact. PSYCH: Mood and affect normal. Cooperative and pleasant.   PLAN Problem List Items Addressed This Visit     Anxiety and depression   Mood doing well with fluoxetine 20mg  per day and hydroxyzine for sleep.  No changes to plan of care today.  F/U in 6 months.       Relevant Medications   FLUoxetine (PROZAC) 20 MG capsule   traZODone (DESYREL) 50 MG tablet   Insomnia due to anxiety and fear   She experiences difficulty sleeping and uses  hydroxyzine occasionally, which causes vivid dreams. Interested in trying trazodone for sleep, known to help without causing next-day grogginess. - Prescribe  trazodone for sleep as needed      Elevated BP without diagnosis of hypertension   Slight elevation in the office today, but stable at home. Monitor closely. No alarm symptoms at this time. Consider medication changes if BP remains higher than 130/80 at home consistently.       Relevant Orders   Hemoglobin A1c (Completed)   CBC with Differential/Platelet (Completed)   Comprehensive metabolic panel (Completed)   Lipid panel (Completed)   BMI 30.0-30.9,adult   On semaglutide for diabetes with additional help for weight. Significant weight loss experienced. Recommend balanced diet and routine exercise to aid in weight management and ensuring that muscle loss is not experienced.       Type 2 diabetes mellitus with hyperglycemia, without long-term current use of insulin (HCC)   She uses the [facility] for glucose monitoring and reports issues with sensor soreness, itching, and occasional inaccurate low readings, possibly due to sensor displacement. She is not currently on metformin but remains on Ozempic with good control.  - Consider alternative sites for [facility] sensor placement, such as the upper arm or abdomen - Consult with [employer] representative for potential sensor improvements      Relevant Orders   Hemoglobin A1c (Completed)   CBC with Differential/Platelet (Completed)   Comprehensive metabolic panel (Completed)   Lipid panel (Completed)   Combined hyperlipidemia associated with type 2 diabetes mellitus (HCC)   Labs for lipids. Diet and exercise recommendations provided.       Relevant Orders   Hemoglobin A1c (Completed)   Comprehensive metabolic panel (Completed)   Lipid panel (Completed)   Attention deficit hyperactivity disorder (ADHD), predominantly inattentive type   She is on Adderall for ADHD management. The pharmacist inquired about her heart health due to age and potential cardiovascular effects of Adderall. She has not experienced any adverse  effects.      Relevant Medications   amphetamine-dextroamphetamine (ADDERALL) 30 MG tablet   amphetamine-dextroamphetamine (ADDERALL) 10 MG tablet   amphetamine-dextroamphetamine (ADDERALL) 30 MG tablet (Start on 03/25/2024)   amphetamine-dextroamphetamine (ADDERALL) 10 MG tablet (Start on 03/25/2024)   amphetamine-dextroamphetamine (ADDERALL) 30 MG tablet (Start on 04/22/2024)   amphetamine-dextroamphetamine (ADDERALL) 10 MG tablet (Start on 04/22/2024)   Low TSH level   Repeat monitoring.       Relevant Orders   Comprehensive metabolic panel (Completed)   TSH (Completed)   VITAMIN D 25 Hydroxy (Vit-D Deficiency, Fractures) (Completed)   Acquired trigger finger   She has trigger fingers on both hands, with previous injections providing some relief. Symptoms are returning, particularly in one finger. She is concerned about injection pain and potential impact on her diabetes. - Administer Toradol injection for immediate relief - Initiate oral anti-inflammatory medication following Toradol injection      Relevant Medications   diclofenac (VOLTAREN) 75 MG EC tablet   Other Visit Diagnoses       BMI 35.0-35.9,adult    -  Primary   Relevant Orders   Hemoglobin A1c (Completed)   CBC with Differential/Platelet (Completed)   Comprehensive metabolic panel (Completed)   Lipid panel (Completed)   TSH (Completed)     Vitamin D deficiency       Relevant Orders   VITAMIN D 25 Hydroxy (Vit-D Deficiency, Fractures) (Completed)       Return in about 6 months (around 08/28/2024) for CPE.  SaraBeth Alacia Rehmann,  DNP, AGNP-c

## 2024-02-26 NOTE — Patient Instructions (Signed)
 You can try moving the freestyle sensor to the abdomen or chest wall.   I have sent refills of your medications. You look great!!   I will let you know if we need to change anything based on your labs.

## 2024-02-27 LAB — CBC WITH DIFFERENTIAL/PLATELET
Basophils Absolute: 0.1 10*3/uL (ref 0.0–0.2)
Basos: 1 %
EOS (ABSOLUTE): 0.1 10*3/uL (ref 0.0–0.4)
Eos: 1 %
Hematocrit: 40.3 % (ref 34.0–46.6)
Hemoglobin: 13.8 g/dL (ref 11.1–15.9)
Immature Grans (Abs): 0 10*3/uL (ref 0.0–0.1)
Immature Granulocytes: 0 %
Lymphocytes Absolute: 3.4 10*3/uL — ABNORMAL HIGH (ref 0.7–3.1)
Lymphs: 35 %
MCH: 30.5 pg (ref 26.6–33.0)
MCHC: 34.2 g/dL (ref 31.5–35.7)
MCV: 89 fL (ref 79–97)
Monocytes Absolute: 0.5 10*3/uL (ref 0.1–0.9)
Monocytes: 5 %
Neutrophils Absolute: 5.6 10*3/uL (ref 1.4–7.0)
Neutrophils: 58 %
Platelets: 304 10*3/uL (ref 150–450)
RBC: 4.53 x10E6/uL (ref 3.77–5.28)
RDW: 11.8 % (ref 11.7–15.4)
WBC: 9.6 10*3/uL (ref 3.4–10.8)

## 2024-02-27 LAB — HEMOGLOBIN A1C
Est. average glucose Bld gHb Est-mCnc: 114 mg/dL
Hgb A1c MFr Bld: 5.6 % (ref 4.8–5.6)

## 2024-02-27 LAB — LIPID PANEL
Chol/HDL Ratio: 3.7 ratio (ref 0.0–4.4)
Cholesterol, Total: 208 mg/dL — ABNORMAL HIGH (ref 100–199)
HDL: 56 mg/dL (ref >39)
LDL Chol Calc (NIH): 131 mg/dL — ABNORMAL HIGH (ref 0–99)
Triglycerides: 118 mg/dL (ref 0–149)
VLDL Cholesterol Cal: 21 mg/dL (ref 5–40)

## 2024-02-27 LAB — COMPREHENSIVE METABOLIC PANEL
ALT: 21 IU/L (ref 0–32)
AST: 24 IU/L (ref 0–40)
Albumin: 4.9 g/dL (ref 3.8–4.9)
Alkaline Phosphatase: 77 IU/L (ref 44–121)
BUN/Creatinine Ratio: 20 (ref 9–23)
BUN: 15 mg/dL (ref 6–24)
Bilirubin Total: 0.4 mg/dL (ref 0.0–1.2)
CO2: 23 mmol/L (ref 20–29)
Calcium: 10.2 mg/dL (ref 8.7–10.2)
Chloride: 100 mmol/L (ref 96–106)
Creatinine, Ser: 0.74 mg/dL (ref 0.57–1.00)
Globulin, Total: 2.2 g/dL (ref 1.5–4.5)
Glucose: 84 mg/dL (ref 70–99)
Potassium: 4 mmol/L (ref 3.5–5.2)
Sodium: 138 mmol/L (ref 134–144)
Total Protein: 7.1 g/dL (ref 6.0–8.5)
eGFR: 98 mL/min/{1.73_m2} (ref >59)

## 2024-02-27 LAB — TSH: TSH: 1.21 u[IU]/mL (ref 0.450–4.500)

## 2024-02-27 LAB — VITAMIN D 25 HYDROXY (VIT D DEFICIENCY, FRACTURES): Vit D, 25-Hydroxy: 19 ng/mL — ABNORMAL LOW (ref 30.0–100.0)

## 2024-03-02 DIAGNOSIS — M6530 Trigger finger, unspecified finger: Secondary | ICD-10-CM | POA: Insufficient documentation

## 2024-03-02 NOTE — Assessment & Plan Note (Signed)
 She has trigger fingers on both hands, with previous injections providing some relief. Symptoms are returning, particularly in one finger. She is concerned about injection pain and potential impact on her diabetes. - Administer Toradol injection for immediate relief - Initiate oral anti-inflammatory medication following Toradol injection

## 2024-03-02 NOTE — Assessment & Plan Note (Signed)
 She is on Adderall for ADHD management. The pharmacist inquired about her heart health due to age and potential cardiovascular effects of Adderall. She has not experienced any adverse effects.

## 2024-03-02 NOTE — Assessment & Plan Note (Signed)
 Labs for lipids. Diet and exercise recommendations provided.

## 2024-03-02 NOTE — Assessment & Plan Note (Signed)
 She uses the [facility] for glucose monitoring and reports issues with sensor soreness, itching, and occasional inaccurate low readings, possibly due to sensor displacement. She is not currently on metformin but remains on Ozempic with good control.  - Consider alternative sites for [facility] sensor placement, such as the upper arm or abdomen - Consult with [employer] representative for potential sensor improvements

## 2024-03-02 NOTE — Assessment & Plan Note (Signed)
 She experiences difficulty sleeping and uses hydroxyzine occasionally, which causes vivid dreams. Interested in trying trazodone for sleep, known to help without causing next-day grogginess. - Prescribe trazodone for sleep as needed

## 2024-03-02 NOTE — Assessment & Plan Note (Signed)
 Slight elevation in the office today, but stable at home. Monitor closely. No alarm symptoms at this time. Consider medication changes if BP remains higher than 130/80 at home consistently.

## 2024-03-02 NOTE — Assessment & Plan Note (Signed)
 On semaglutide for diabetes with additional help for weight. Significant weight loss experienced. Recommend balanced diet and routine exercise to aid in weight management and ensuring that muscle loss is not experienced.

## 2024-03-02 NOTE — Assessment & Plan Note (Signed)
Mood doing well with fluoxetine 20mg  per day and hydroxyzine for sleep.  No changes to plan of care today.  F/U in 6 months.

## 2024-03-02 NOTE — Assessment & Plan Note (Signed)
Repeat monitoring

## 2024-03-08 ENCOUNTER — Other Ambulatory Visit: Payer: Self-pay | Admitting: Nurse Practitioner

## 2024-03-10 ENCOUNTER — Encounter: Payer: Self-pay | Admitting: Nurse Practitioner

## 2024-03-10 DIAGNOSIS — E559 Vitamin D deficiency, unspecified: Secondary | ICD-10-CM

## 2024-03-14 ENCOUNTER — Encounter: Payer: Self-pay | Admitting: Nurse Practitioner

## 2024-03-14 MED ORDER — VITAMIN D (ERGOCALCIFEROL) 1.25 MG (50000 UNIT) PO CAPS: 50000.0000 [IU] | ORAL_CAPSULE | ORAL | 3 refills | Status: AC

## 2024-03-15 ENCOUNTER — Other Ambulatory Visit: Payer: Self-pay | Admitting: Nurse Practitioner

## 2024-03-18 ENCOUNTER — Telehealth: Payer: Self-pay

## 2024-03-18 NOTE — Telephone Encounter (Signed)
 Pt. Has new insurance Haley Charles. Do you have any PA for this pts. Ozempic or Freestyle libre Haley Charles.   Copied from CRM [id number]. Topic: Clinical - Prescription Issue >> Mar 18, 2024 12:57 PM Geroge Baseman wrote: Reason for CRM: Patient has new insurance and she needs a prior authorization on her ozempic and libra 3 sensors. She states she has [employer],  Member ID: [id number] Group: [id number]. She needs all prescription sent over to the [facility] on [address] from this point forward.

## 2024-03-21 NOTE — Telephone Encounter (Signed)
Forwarded to PA team .

## 2024-03-22 ENCOUNTER — Telehealth: Payer: Self-pay

## 2024-03-22 ENCOUNTER — Other Ambulatory Visit (HOSPITAL_COMMUNITY): Payer: Self-pay

## 2024-03-22 NOTE — Telephone Encounter (Signed)
 Pharmacy Patient Advocate Encounter  Received notification from [facility] that Prior Authorization for Ozempic (2 MG/DOSE) 8MG /3ML pen-injectors has been APPROVED from 4.15.26 to 4.16.26. Ran test claim, Copay is $74.99 for a 28month. This test claim was processed through [facility]- copay amounts may vary at other pharmacies due to pharmacy/plan contracts, or as the patient moves through the different stages of their insurance plan.   PA #/Case ID/Reference #: (Key: [id number])    Pts pharmacy has been contacted & rx is ready for pickup as well as her libre sensor

## 2024-03-22 NOTE — Telephone Encounter (Signed)
 Pharmacy Patient Advocate Encounter   Received notification from Physician's Office that prior authorization for FreeStyle Libre 3 Plus Sensor  is required/requested.   Insurance verification completed.   The patient is insured through [facility] .   Per test claim: PA required; PA submitted to above mentioned insurance via CoverMyMeds Key/confirmation #/EOC (Key: [id number])    Status is pending

## 2024-03-22 NOTE — Telephone Encounter (Signed)
 Pharmacy Patient Advocate Encounter   Received notification from Physician's Office that prior authorization for Ozempic (2 MG/DOSE) 8MG /3ML pen-injectors is required/requested.   Insurance verification completed.   The patient is insured through [facility] .   Per test claim: PA required; PA submitted to above mentioned insurance via CoverMyMeds Key/confirmation #/EOC (Key: [id number])    Status is pending

## 2024-03-22 NOTE — Telephone Encounter (Signed)
 Update:  Pharmacy Patient Advocate Encounter  Received notification from [employer] that Prior Authorization for FreeStyle Libre 3 Plus Sensor has been CANCELLED due to ''No Prior Authorization required''. The sensor is only available for a 30 day supply & cost around about 61.75 per test claim.    PA #/Case ID/Reference #: (Key: [id number])       No p/a req:     Test claim:

## 2024-03-23 ENCOUNTER — Telehealth: Payer: Self-pay

## 2024-03-23 ENCOUNTER — Other Ambulatory Visit: Payer: Self-pay

## 2024-03-23 ENCOUNTER — Other Ambulatory Visit (HOSPITAL_COMMUNITY): Payer: Self-pay

## 2024-03-23 DIAGNOSIS — N951 Menopausal and female climacteric states: Secondary | ICD-10-CM

## 2024-03-23 DIAGNOSIS — E1169 Type 2 diabetes mellitus with other specified complication: Secondary | ICD-10-CM

## 2024-03-23 DIAGNOSIS — Z6830 Body mass index (BMI) 30.0-30.9, adult: Secondary | ICD-10-CM

## 2024-03-23 DIAGNOSIS — R030 Elevated blood-pressure reading, without diagnosis of hypertension: Secondary | ICD-10-CM

## 2024-03-23 DIAGNOSIS — E1165 Type 2 diabetes mellitus with hyperglycemia: Secondary | ICD-10-CM

## 2024-03-23 DIAGNOSIS — I700 Atherosclerosis of aorta: Secondary | ICD-10-CM

## 2024-03-23 MED ORDER — ESTRADIOL 0.25 MG/0.25GM TD GEL: TRANSDERMAL | 11 refills | Status: DC

## 2024-03-23 MED ORDER — SEMAGLUTIDE (2 MG/DOSE) 8 MG/3ML ~~LOC~~ SOPN: 2.0000 mg | PEN_INJECTOR | SUBCUTANEOUS | 1 refills | Status: DC

## 2024-03-23 NOTE — Telephone Encounter (Signed)
 Hi Haley Charles,                             I received a request to do a Prior Authorization request for the pts Estradiol 0.25 MG/0.25GM GEL. However after calling the pts pref'd pharmacy they stated ''the script is inactive, and she would need a new script''. I see the pt asked about this Rx in a mychart message on Yesterday. Could you please clarify if she's still on this Rx, and if so please send in a new script if appropriate.

## 2024-03-24 ENCOUNTER — Other Ambulatory Visit (HOSPITAL_COMMUNITY): Payer: Self-pay

## 2024-03-24 ENCOUNTER — Telehealth: Payer: Self-pay

## 2024-03-24 NOTE — Telephone Encounter (Signed)
 Pharmacy Patient Advocate Encounter Received a request via [facility]  Insurance verification completed.   The patient is insured through  [facility]    Ran test claim for Estradiol 0.25 MG/0.25GM GEL . Currently a quantity of 30 is a 30 day supply and the co-pay is 29.68 . No P/a is needed at this time  This test claim was processed through [facility]- copay amounts may vary at other pharmacies due to pharmacy/plan contracts, or as the patient moves through the different stages of their insurance plan.    Per test claim the rx has been filled as of 4/16

## 2024-03-31 ENCOUNTER — Encounter: Payer: Self-pay | Admitting: Nurse Practitioner

## 2024-04-12 ENCOUNTER — Encounter: Payer: Self-pay | Admitting: Orthopaedic Surgery

## 2024-04-12 ENCOUNTER — Ambulatory Visit: Admitting: Orthopaedic Surgery

## 2024-04-12 DIAGNOSIS — M65341 Trigger finger, right ring finger: Secondary | ICD-10-CM | POA: Diagnosis not present

## 2024-04-12 DIAGNOSIS — M65322 Trigger finger, left index finger: Secondary | ICD-10-CM

## 2024-04-12 DIAGNOSIS — M65331 Trigger finger, right middle finger: Secondary | ICD-10-CM

## 2024-04-12 MED ORDER — BUPIVACAINE HCL 0.5 % IJ SOLN
0.3300 mL | INTRAMUSCULAR | Status: AC | PRN
  Administered 2024-04-12: .33 mL

## 2024-04-12 MED ORDER — BUPIVACAINE HCL 0.5 % IJ SOLN
0.3300 mL | INTRAMUSCULAR | Status: AC | PRN
Start: 2024-04-12 — End: 2024-04-12
  Administered 2024-04-12: .33 mL

## 2024-04-12 MED ORDER — METHYLPREDNISOLONE ACETATE 40 MG/ML IJ SUSP
13.3300 mg | INTRAMUSCULAR | Status: AC | PRN
  Administered 2024-04-12: 13.33 mg

## 2024-04-12 MED ORDER — LIDOCAINE HCL 1 % IJ SOLN
0.3000 mL | INTRAMUSCULAR | Status: AC | PRN
Start: 2024-04-12 — End: 2024-04-12
  Administered 2024-04-12: .3 mL

## 2024-04-12 MED ORDER — LIDOCAINE HCL 1 % IJ SOLN
0.3000 mL | INTRAMUSCULAR | Status: AC | PRN
  Administered 2024-04-12: .3 mL

## 2024-04-12 NOTE — Progress Notes (Signed)
 Office Visit Note   Patient: Haley Charles           Date of Birth: 03/13/1971           MRN: [id number] Visit Date: 04/12/2024              Requested by: Annella Kief, NP [address] [place],  [place] [address] PCP: Annella Kief, NP   Assessment & Plan: Visit Diagnoses:  1. Trigger finger, right middle finger   2. Trigger finger, right ring finger   3. Trigger finger, left index finger     Plan: History of Present Illness [name] "Haley Charles" is a 53 year old female who presents with recurrence of trigger finger symptoms in multiple fingers.  She experiences symptoms in her left index finger, right ring finger, and right middle finger. The symptoms have returned to her initial severity, causing discomfort and functional limitations. She previously received injections for these fingers, and this visit marks the second round of injections due to persistent symptoms.  Exam of the hands are consistent with trigger fingers of the left index, right long and right ring fingers.  Assessment and Plan Trigger finger Recurrent trigger finger in left index, right ring, and right middle fingers. Second injection for each. Disease process explained.  Surgery is definitive treatment. Diminishing returns from additional injetions after second injection. - Administer trigger finger injections to left index, right ring, and right middle fingers.  Follow-Up Instructions: No follow-ups on file.   Orders:  Orders Placed This Encounter  Procedures  . Hand/UE Inj: L index A1  . Hand/UE Inj: R long A1  . Hand/UE Inj: R ring A1   No orders of the defined types were placed in this encounter.     Procedures: Hand/UE Inj: L index A1 for trigger finger on 04/12/2024 1:50 PM Indications: pain Details: 25 G needle Medications: 0.3 mL lidocaine  1 %; 0.33 mL bupivacaine  0.5 %; 13.33 mg methylPREDNISolone  acetate 40 MG/ML Outcome: tolerated well, no immediate complications Consent was  given by the patient. Patient was prepped and draped in the usual sterile fashion.    Hand/UE Inj: R long A1 for trigger finger on 04/12/2024 1:50 PM Indications: pain Details: 25 G needle Medications: 0.3 mL lidocaine  1 %; 0.33 mL bupivacaine  0.5 %; 13.33 mg methylPREDNISolone  acetate 40 MG/ML Outcome: tolerated well, no immediate complications Consent was given by the patient. Patient was prepped and draped in the usual sterile fashion.    Hand/UE Inj: R ring A1 for trigger finger on 04/12/2024 1:50 PM Indications: pain Details: 25 G needle Medications: 0.3 mL lidocaine  1 %; 0.33 mL bupivacaine  0.5 %; 13.33 mg methylPREDNISolone  acetate 40 MG/ML Outcome: tolerated well, no immediate complications Consent was given by the patient. Patient was prepped and draped in the usual sterile fashion.      Clinical Data: No additional findings.   Subjective: Chief Complaint  Patient presents with  . Right Hand - Follow-up    Trigger finger  . Left Hand - Follow-up    Trigger finger     Ortho Exam Exams of hands consistent with trigger fingers of the left index, right long, right ring fingers.  Specialty Comments:  No specialty comments available.  Imaging: No results found.   PMFS History: Patient Active Problem List   Diagnosis Date Noted  . Acquired trigger finger 03/02/2024  . Stone of salivary gland or duct 04/28/2023  . Low TSH level 04/28/2023  . Attention deficit  hyperactivity disorder (ADHD), predominantly inattentive type 08/13/2022  . Arthralgia of both knees 12/25/2021  . Combined hyperlipidemia associated with type 2 diabetes mellitus (HCC) 06/12/2021  . Type 2 diabetes mellitus with hyperglycemia, without long-term current use of insulin (HCC) 03/11/2021  . Anxiety and depression 03/04/2021  . Insomnia due to anxiety and fear 03/04/2021  . Mixed stress and urge urinary incontinence 03/04/2021  . Vaginal dryness, menopausal 03/04/2021  . 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 Elevated BP without diagnosis of hypertension 03/04/2021  . BMI 30.0-30.9,adult 03/04/2021   Past Medical History:  Diagnosis Date  . Acute flank pain 06/18/2018  . Annual physical exam 03/11/2021  . Anxiety   . Colon polyp   . Depression   . Diverticulosis   . Encounter for annual routine gynecological examination 03/11/2021  . Encounter to establish care 03/04/2021  . Enteritis    History of in colonoscopy 2006  . Flank pain 06/18/2018  . GERD (gastroesophageal reflux disease)   . History of kidney stones   . Laboratory examination ordered as part of a routine general medical examination 03/04/2021  . Mixed hyperlipidemia 03/11/2021  . Peripheral vascular disease (HCC)    right leg  . PONV (postoperative nausea and vomiting)   . Pyelonephritis 06/23/2018  . Renal calculus, right 06/21/2018  . Spasm of thoracic back muscle 03/04/2021  . Weight gain 03/04/2021    Family History  Problem Relation Age of Onset  . Colon polyps Mother   . Esophagitis Mother   . Esophageal cancer Mother   . Cancer Mother   . Stroke Mother   . CAD Mother   . CAD Father   . Hypertension Father   . Diabetes Mellitus II Father   . Lung cancer Father   . Cancer Father   . Rectal cancer Neg Hx   . Stomach cancer Neg Hx   . Colon cancer Neg Hx   . Breast cancer Neg Hx     Past Surgical History:  Procedure Laterality Date  . APPENDECTOMY    . COLONOSCOPY  04/16/2018  . IR URETERAL STENT RIGHT NEW ACCESS W/O SEP NEPHROSTOMY CATH  06/18/2018  . NEPHROLITHOTOMY Right 06/21/2018   Procedure: RIGHT NEPHROLITHOTOMY PERCUTANEOUS;  Surgeon: Ottelin, Mark, MD;  Location: [facility];  Service: Urology;  Laterality: Right;  . TUBAL LIGATION  21 years ago  . UPPER GI ENDOSCOPY     Social History   Occupational History  . Occupation: [employer]  Tobacco Use  . Smoking status: Former    Current packs/day: 0.00    Average packs/day: 1  pack/day for 14.0 years (14.0 ttl pk-yrs)    Types: Cigarettes    Start date: 62    Quit date: 2009    Years since quitting: 16.3  . Smokeless tobacco: Never  Vaping Use  . Vaping status: Never Used  Substance and Sexual Activity  . Alcohol use: Yes    Comment: occassional  . Drug use: Not Currently  . Sexual activity: Yes    Birth control/protection: Surgical

## 2024-04-21 ENCOUNTER — Other Ambulatory Visit (HOSPITAL_COMMUNITY): Payer: Self-pay

## 2024-04-21 ENCOUNTER — Telehealth: Payer: Self-pay

## 2024-04-21 NOTE — Telephone Encounter (Signed)
 Pharmacy Patient Advocate Encounter  Patient no longer has [facility]  Insurance verification completed.   The patient is insured through [employer]   Ran test claim for [employer] 3 Plus Sensor. Currently a quantity of 3 is a 30 day supply and the co-pay is 74.99 .NO p/a is needed at this time.  This test claim was processed through [facility]- copay amounts may vary at other pharmacies due to pharmacy/plan contracts, or as the patient moves through the different stages of their insurance plan.

## 2024-05-04 ENCOUNTER — Other Ambulatory Visit (HOSPITAL_COMMUNITY): Payer: Self-pay

## 2024-06-07 ENCOUNTER — Encounter: Payer: Self-pay | Admitting: Nurse Practitioner

## 2024-06-07 DIAGNOSIS — E1169 Type 2 diabetes mellitus with other specified complication: Secondary | ICD-10-CM

## 2024-06-07 DIAGNOSIS — F900 Attention-deficit hyperactivity disorder, predominantly inattentive type: Secondary | ICD-10-CM

## 2024-06-09 ENCOUNTER — Other Ambulatory Visit: Payer: Self-pay | Admitting: Nurse Practitioner

## 2024-06-09 DIAGNOSIS — E1165 Type 2 diabetes mellitus with hyperglycemia: Secondary | ICD-10-CM

## 2024-06-09 DIAGNOSIS — R030 Elevated blood-pressure reading, without diagnosis of hypertension: Secondary | ICD-10-CM

## 2024-06-09 DIAGNOSIS — Z6830 Body mass index (BMI) 30.0-30.9, adult: Secondary | ICD-10-CM

## 2024-06-09 DIAGNOSIS — I700 Atherosclerosis of aorta: Secondary | ICD-10-CM

## 2024-06-09 DIAGNOSIS — E1169 Type 2 diabetes mellitus with other specified complication: Secondary | ICD-10-CM

## 2024-06-09 MED ORDER — AMPHETAMINE-DEXTROAMPHETAMINE 30 MG PO TABS: 30.0000 mg | ORAL_TABLET | Freq: Every day | ORAL | 0 refills | Status: DC

## 2024-06-09 MED ORDER — ATORVASTATIN CALCIUM 20 MG PO TABS: 20.0000 mg | ORAL_TABLET | Freq: Every day | ORAL | 3 refills | Status: DC

## 2024-06-09 MED ORDER — AMPHETAMINE-DEXTROAMPHETAMINE 10 MG PO TABS: 10.0000 mg | ORAL_TABLET | Freq: Every day | ORAL | 0 refills | Status: DC

## 2024-06-09 MED ORDER — AMPHETAMINE-DEXTROAMPHETAMINE 10 MG PO TABS
10.0000 mg | ORAL_TABLET | Freq: Every day | ORAL | 0 refills | Status: DC
Start: 2024-08-04 — End: 2024-09-06

## 2024-06-13 ENCOUNTER — Other Ambulatory Visit: Payer: Self-pay | Admitting: Nurse Practitioner

## 2024-06-13 DIAGNOSIS — R030 Elevated blood-pressure reading, without diagnosis of hypertension: Secondary | ICD-10-CM

## 2024-06-13 DIAGNOSIS — I708 Atherosclerosis of other arteries: Secondary | ICD-10-CM

## 2024-06-13 DIAGNOSIS — E1165 Type 2 diabetes mellitus with hyperglycemia: Secondary | ICD-10-CM

## 2024-06-13 DIAGNOSIS — E1169 Type 2 diabetes mellitus with other specified complication: Secondary | ICD-10-CM

## 2024-06-13 DIAGNOSIS — Z6830 Body mass index (BMI) 30.0-30.9, adult: Secondary | ICD-10-CM

## 2024-06-27 ENCOUNTER — Telehealth: Payer: Self-pay

## 2024-06-27 NOTE — Telephone Encounter (Signed)
 Refill authorization: Hydroxyzine  HCL 50 mg tab  @ [facility] [address]

## 2024-06-28 ENCOUNTER — Other Ambulatory Visit: Payer: Self-pay

## 2024-06-28 DIAGNOSIS — F409 Phobic anxiety disorder, unspecified: Secondary | ICD-10-CM

## 2024-06-28 DIAGNOSIS — F419 Anxiety disorder, unspecified: Secondary | ICD-10-CM

## 2024-06-28 MED ORDER — HYDROXYZINE HCL 50 MG PO TABS: 50.0000 mg | ORAL_TABLET | ORAL | 3 refills | Status: AC

## 2024-07-11 ENCOUNTER — Encounter: Payer: Self-pay | Admitting: Nurse Practitioner

## 2024-07-14 ENCOUNTER — Other Ambulatory Visit: Payer: Self-pay

## 2024-07-14 MED ORDER — MELOXICAM 15 MG PO TABS: 15.0000 mg | ORAL_TABLET | Freq: Every day | ORAL | 3 refills | Status: DC

## 2024-07-15 ENCOUNTER — Other Ambulatory Visit: Payer: Self-pay | Admitting: Medical Genetics

## 2024-07-25 ENCOUNTER — Other Ambulatory Visit: Payer: Self-pay

## 2024-07-25 DIAGNOSIS — M6530 Trigger finger, unspecified finger: Secondary | ICD-10-CM

## 2024-08-05 ENCOUNTER — Ambulatory Visit: Admitting: Orthopaedic Surgery

## 2024-08-05 DIAGNOSIS — M65341 Trigger finger, right ring finger: Secondary | ICD-10-CM | POA: Diagnosis not present

## 2024-08-05 DIAGNOSIS — M65322 Trigger finger, left index finger: Secondary | ICD-10-CM | POA: Diagnosis not present

## 2024-08-05 DIAGNOSIS — M65331 Trigger finger, right middle finger: Secondary | ICD-10-CM

## 2024-08-05 DIAGNOSIS — M65342 Trigger finger, left ring finger: Secondary | ICD-10-CM | POA: Diagnosis not present

## 2024-08-05 DIAGNOSIS — M65321 Trigger finger, right index finger: Secondary | ICD-10-CM

## 2024-08-05 NOTE — Progress Notes (Signed)
   Office Visit Note   Patient: Haley Charles           Date of Birth: 09-03-1971           MRN: [id number] Visit Date: 08/05/2024              Requested by: Oris Camie BRAVO, NP [address] [place],  [place] [address] PCP: Oris Camie BRAVO, NP   Assessment & Plan: Visit Diagnoses:  1. Trigger finger, right middle finger   2. Trigger finger, right ring finger   3. Trigger finger, left index finger   4. Trigger ring finger of left hand   5. Trigger index finger of left hand   6. Trigger index finger of right hand     Plan: History of Present Illness Haley Charles is a 53 year old female who presents with recurrent trigger finger in both hands.  She experiences stiffness, aching, and swelling in the index, middle, and ring fingers of both hands, with symptoms worsening at night. In the mornings, she requires hot water to loosen her fingers. Her right hand is braced to prevent excessive bending. Past injections have provided temporary relief, but meloxicam  is ineffective. Online exercises have worsened her symptoms. She has difficulty making a fist, and her fingers occasionally lock, especially the right index finger. Her father had trigger finger and type 2 diabetes, and her mother had similar finger issues. She cannot take Advil or Aleve.  Physical Exam MUSCULOSKELETAL: Right index finger locking.  Assessment and Plan Bilateral index, middle, and ring finger trigger fingers with swelling and stiffness Chronic trigger fingers with swelling and stiffness. Previous treatments ineffective. Corticosteroid injections provided temporary relief but not suitable long-term. Surgical intervention recommended. Right hand more symptomatic, right index finger locking. . - Discussed surgical intervention, recommend surgery on two fingers at a time. - Advised against further corticosteroid injections due to diminishing returns and potential harm. - Discussed over-the-counter  anti-inflammatory options for swelling, noted inability to take NSAIDs. - Consider short course of oral steroids for acute swelling, not a long-term solution.  Possible autoimmune or inflammatory arthritis Evaluation for underlying autoimmune or inflammatory arthritis warranted due to persistent swelling and stiffness. - Order blood work to evaluate for autoimmune or inflammatory arthritis.  Follow-Up Instructions: No follow-ups on file.   Orders:  No orders of the defined types were placed in this encounter.  No orders of the defined types were placed in this encounter.

## 2024-08-07 LAB — RHEUMATOID FACTOR: Rheumatoid fact SerPl-aCnc: <10 [IU]/mL (ref <14)

## 2024-08-07 LAB — ANA: Anti Nuclear Antibody (ANA): NEGATIVE

## 2024-08-07 LAB — URIC ACID: Uric Acid, Serum: 3.3 mg/dL (ref 2.5–7.0)

## 2024-08-07 LAB — SEDIMENTATION RATE

## 2024-08-10 ENCOUNTER — Encounter: Payer: Self-pay | Admitting: Orthopaedic Surgery

## 2024-08-18 ENCOUNTER — Other Ambulatory Visit

## 2024-08-23 ENCOUNTER — Other Ambulatory Visit: Payer: Self-pay | Admitting: Nurse Practitioner

## 2024-08-23 DIAGNOSIS — Z6830 Body mass index (BMI) 30.0-30.9, adult: Secondary | ICD-10-CM

## 2024-08-23 DIAGNOSIS — R030 Elevated blood-pressure reading, without diagnosis of hypertension: Secondary | ICD-10-CM

## 2024-08-23 DIAGNOSIS — I700 Atherosclerosis of aorta: Secondary | ICD-10-CM

## 2024-08-23 DIAGNOSIS — E1165 Type 2 diabetes mellitus with hyperglycemia: Secondary | ICD-10-CM

## 2024-08-23 DIAGNOSIS — E1169 Type 2 diabetes mellitus with other specified complication: Secondary | ICD-10-CM

## 2024-08-30 ENCOUNTER — Other Ambulatory Visit: Payer: Self-pay | Admitting: Nurse Practitioner

## 2024-08-30 DIAGNOSIS — I708 Atherosclerosis of other arteries: Secondary | ICD-10-CM

## 2024-08-30 DIAGNOSIS — E1165 Type 2 diabetes mellitus with hyperglycemia: Secondary | ICD-10-CM

## 2024-08-30 DIAGNOSIS — R030 Elevated blood-pressure reading, without diagnosis of hypertension: Secondary | ICD-10-CM

## 2024-08-30 DIAGNOSIS — E1169 Type 2 diabetes mellitus with other specified complication: Secondary | ICD-10-CM

## 2024-08-30 DIAGNOSIS — Z6830 Body mass index (BMI) 30.0-30.9, adult: Secondary | ICD-10-CM

## 2024-09-02 ENCOUNTER — Other Ambulatory Visit (HOSPITAL_COMMUNITY): Payer: Self-pay

## 2024-09-02 ENCOUNTER — Telehealth: Payer: Self-pay

## 2024-09-02 NOTE — Telephone Encounter (Signed)
 Please note our office has received a Prior Authorization request for FreeStyle Libre 3 plus Sensor. However one is not needed at this time. I have called the pts pref'd pharmacy and confirmed that the pt has picked up her Rx as of 08/24/24 for 74.99

## 2024-09-05 NOTE — Progress Notes (Unsigned)
 Covid: Flu: Pneumonia: Shingrix:  Eye Exam:   Catheline Doing, DNP, AGNP-c [facility] [address] [place], [place] [address] Main Office [phone] VISIT TYPE: CPE on 09/06/2024 Today's Vitals   09/06/24 1430  BP: 130/82  Pulse: 96  Weight: 142 lb (64.4 kg)  Height: 5' 2.25 (1.581 m)   Body mass index is 25.76 kg/m. BP 130/82   Pulse 96   Ht 5' 2.25 (1.581 m)   Wt 142 lb (64.4 kg)   BMI 25.76 kg/m   Subjective:    Patient ID: Haley Charles, female    DOB: 07-03-1971, 53 y.o.   MRN: [id number]  HPI: History of Present Illness Haley Charles is a 53 year old female who presents for her annual exam.   She experiences symptoms of trigger finger, primarily affecting two fingers on her hand, with a burning sensation and bruising. There is limited range of motion, especially in the morning. Typing exacerbates the condition, but she cannot avoid it due to work. She manages symptoms with hot and cold water therapy and exercises. She is concerned about potential nerve damage from surgery.  She stopped taking Lipitor at the beginning of September, suspecting it might be related to her symptoms. There has been slight improvement since discontinuation, but symptoms persist. She uses Advil for inflammation, which is effective, though she is aware of the need to limit its use.  She reports jaw pain with a burning sensation and popping when biting down. No difficulty swallowing, but there is a history of gland issues with white material expulsion, which has resolved. Some soreness and puffiness remain in the area.  Regarding ADHD medication, she experiences nausea and headaches with the amphetamine  salts version, preferring the dextroamphetamine  version. She monitors her heart rate and reports occasional racing heart but no persistent issues.  She mentions a past incident of prolonged soreness from sensor placement, suggesting possible nerve damage. Skin  irritation from sensor adhesive is managed with skin prep solutions.  Her vitamin D  levels have been low due to limited sun exposure, and she continues to take supplements. She is an active person who enjoys spending time with her grandchildren.  Pertinent items are noted in HPI.  Most Recent Depression Screen:     09/06/2024    2:28 PM 02/26/2024   11:54 AM 08/13/2022   10:03 AM 06/23/2022    9:20 AM 03/11/2021    9:35 AM  Depression screen PHQ 2/9  Decreased Interest 0 0 0 0 0  Down, Depressed, Hopeless 0 0 1 0 2  PHQ - 2 Score 0 0 1 0 2  Altered sleeping   1 0 1  Tired, decreased energy   0 0 2  Change in appetite   0 0 2  Feeling bad or failure about yourself    0 0 0  Trouble concentrating   2 0 3  Moving slowly or fidgety/restless   0 0 2  Suicidal thoughts   0 0 0  PHQ-9 Score   4 0 12  Difficult doing work/chores   Extremely dIfficult Not difficult at all Very difficult   Most Recent Anxiety Screen:     03/11/2021    9:36 AM 03/04/2021   10:27 AM  GAD 7 : Generalized Anxiety Score  Nervous, Anxious, on Edge 2 3  Control/stop worrying 2 3  Worry too much - different things 2 3  Trouble relaxing 2 2  Restless 2 1  Easily annoyed or irritable  2 3  Afraid - awful might happen 1 2  Total GAD 7 Score 13 17  Anxiety Difficulty Very difficult Very difficult   Most Recent Fall Screen:    09/06/2024    2:27 PM 02/26/2024   11:53 AM 08/13/2022   10:03 AM 06/23/2022    9:20 AM 03/11/2021    9:34 AM  Fall Risk   Falls in the past year? 0 0 0 0 0  Number falls in past yr: 0 0 0 0 0  Injury with Fall? 0 0 0 0   Risk for fall due to : No Fall Risks No Fall Risks No Fall Risks No Fall Risks No Fall Risks  Follow up Falls evaluation completed Falls evaluation completed Education provided;Falls evaluation completed  Falls evaluation completed       Data saved with a previous flowsheet row definition    Past medical history, surgical history, medications, allergies, family history  and social history reviewed with patient today and changes made to appropriate areas of the chart.  Past Medical History:  Past Medical History:  Diagnosis Date   Acute flank pain 06/18/2018   Annual physical exam 03/11/2021   Anxiety    Colon polyp    Depression    Diverticulosis    Encounter for annual routine gynecological examination 03/11/2021   Encounter to establish care 03/04/2021   Enteritis    History of in colonoscopy 2006   Flank pain 06/18/2018   GERD (gastroesophageal reflux disease)    History of kidney stones    Laboratory examination ordered as part of a routine general medical examination 03/04/2021   Mixed hyperlipidemia 03/11/2021   Peripheral vascular disease    right leg   PONV (postoperative nausea and vomiting)    Pyelonephritis 06/23/2018   Renal calculus, right 06/21/2018   Spasm of thoracic back muscle 03/04/2021   Weight gain 03/04/2021   Medications:  Current Outpatient Medications on File Prior to Visit  Medication Sig   Continuous Glucose Sensor (FREESTYLE LIBRE 3 PLUS SENSOR) MISC Apply new sensor every 15 days   Estradiol  0.25 MG/0.25GM GEL Apply pea sized amount to thigh daily. Alternate between left and right daily.   FLUoxetine  (PROZAC ) 20 MG capsule Take one tablet (20mg ) by mouth every morning.   hydrOXYzine  (ATARAX ) 50 MG tablet Take 1 tablet (50 mg total) by mouth as directed. 1 nightly repeat only if needed   OZEMPIC , 2 MG/DOSE, 8 MG/3ML SOPN INJECT 2 MG AS DIRECTED ONCE A WEEK   traZODone  (DESYREL ) 50 MG tablet Take 0.5-2 tablets (25-100 mg total) by mouth at bedtime as needed for sleep.   Vitamin D , Ergocalciferol , (DRISDOL ) 1.25 MG (50000 UNIT) CAPS capsule Take 1 capsule (50,000 Units total) by mouth every 7 (seven) days.   No current facility-administered medications on file prior to visit.   Surgical History:  Past Surgical History:  Procedure Laterality Date   APPENDECTOMY     COLONOSCOPY  04/16/2018   IR URETERAL STENT RIGHT NEW ACCESS  W/O SEP NEPHROSTOMY CATH  06/18/2018   NEPHROLITHOTOMY Right 06/21/2018   Procedure: RIGHT NEPHROLITHOTOMY PERCUTANEOUS;  Surgeon: Ottelin, Mark, MD;  Location: [facility];  Service: Urology;  Laterality: Right;   TUBAL LIGATION  21 years ago   UPPER GI ENDOSCOPY     Allergies:  Allergies  Allergen Reactions   Latex Rash   Family History:  Family History  Problem Relation Age of Onset   Colon polyps Mother    Esophagitis Mother  Esophageal cancer Mother    Cancer Mother    Stroke Mother    CAD Mother    CAD Father    Hypertension Father    Diabetes Mellitus II Father    Lung cancer Father    Cancer Father    Rectal cancer Neg Hx    Stomach cancer Neg Hx    Colon cancer Neg Hx    Breast cancer Neg Hx        Objective:    BP 130/82   Pulse 96   Ht 5' 2.25 (1.581 m)   Wt 142 lb (64.4 kg)   BMI 25.76 kg/m   Wt Readings from Last 3 Encounters:  09/06/24 142 lb (64.4 kg)  02/26/24 157 lb (71.2 kg)  07/28/23 156 lb 6.4 oz (70.9 kg)    Physical Exam Vitals and nursing note reviewed.  Constitutional:      General: She is not in acute distress.    Appearance: Normal appearance.  HENT:     Head: Normocephalic and atraumatic.     Right Ear: Hearing, tympanic membrane, ear canal and external ear normal.     Left Ear: Hearing, tympanic membrane, ear canal and external ear normal.     Nose: Nose normal.     Right Sinus: No maxillary sinus tenderness or frontal sinus tenderness.     Left Sinus: No maxillary sinus tenderness or frontal sinus tenderness.     Mouth/Throat:     Lips: Pink.     Mouth: Mucous membranes are moist.     Pharynx: Oropharynx is clear.  Eyes:     General: Lids are normal. Vision grossly intact.     Extraocular Movements: Extraocular movements intact.     Conjunctiva/sclera: Conjunctivae normal.     Pupils: Pupils are equal, round, and reactive to light.     Funduscopic exam:    Right eye: Red reflex present.        Left eye: Red reflex present.     Visual Fields: Right eye visual fields normal and left eye visual fields normal.  Neck:     Thyroid : No thyromegaly.     Vascular: No carotid bruit.  Cardiovascular:     Rate and Rhythm: Normal rate and regular rhythm.     Chest Wall: PMI is not displaced.     Pulses: Normal pulses.          Dorsalis pedis pulses are 2+ on the right side and 2+ on the left side.       Posterior tibial pulses are 2+ on the right side and 2+ on the left side.     Heart sounds: Normal heart sounds. No murmur heard. Pulmonary:     Effort: Pulmonary effort is normal. No respiratory distress.     Breath sounds: Normal breath sounds.  Abdominal:     General: Abdomen is flat. Bowel sounds are normal. There is no distension.     Palpations: Abdomen is soft. There is no hepatomegaly, splenomegaly or mass.     Tenderness: There is no abdominal tenderness. There is no right CVA tenderness, left CVA tenderness, guarding or rebound.  Musculoskeletal:     Cervical back: Full passive range of motion without pain, normal range of motion and neck supple. No tenderness.     Right lower leg: No edema.     Left lower leg: No edema.     Comments: Trigger finger to 1-3 finger on both hands bilaterally. ROM intact today, but limitations reported  upon awakening.   Feet:     Left foot:     Toenail Condition: Left toenails are normal.  Lymphadenopathy:     Cervical: No cervical adenopathy.     Upper Body:     Right upper body: No supraclavicular adenopathy.     Left upper body: No supraclavicular adenopathy.  Skin:    General: Skin is warm and dry.     Capillary Refill: Capillary refill takes less than 2 seconds.     Nails: There is no clubbing.  Neurological:     General: No focal deficit present.     Mental Status: She is alert and oriented to person, place, and time.     GCS: GCS eye subscore is 4. GCS verbal subscore is 5. GCS motor subscore is 6.     Sensory: Sensation is intact.     Motor: Motor function is  intact.     Coordination: Coordination is intact.     Gait: Gait is intact.     Deep Tendon Reflexes: Reflexes are normal and symmetric.  Psychiatric:        Attention and Perception: Attention normal.        Mood and Affect: Mood normal.        Speech: Speech normal.        Behavior: Behavior normal. Behavior is cooperative.        Thought Content: Thought content normal.        Cognition and Memory: Cognition and memory normal.        Judgment: Judgment normal.      Results for orders placed or performed in visit on 08/05/24  Rheumatoid Factor   Collection Time: 08/05/24 11:59 AM  Result Value Ref Range   Rheumatoid fact SerPl-aCnc <10 <14 IU/mL  Uric acid   Collection Time: 08/05/24 11:59 AM  Result Value Ref Range   Uric Acid, Serum 3.3 2.5 - 7.0 mg/dL  Sed Rate (ESR)   Collection Time: 08/05/24 11:59 AM  Result Value Ref Range   Sed Rate CANCELED   Antinuclear Antib (ANA)   Collection Time: 08/05/24 11:59 AM  Result Value Ref Range   Anti Nuclear Antibody (ANA) NEGATIVE NEGATIVE       Assessment & Plan:   Problem List Items Addressed This Visit     Type 2 diabetes mellitus with hyperglycemia, without long-term current use of insulin (HCC)   Type 2 diabetes mellitus with good glycemic control. Issues with adhesive reaction from glucose monitor. Will plan to continue to monitor.        Relevant Orders   Microalbumin/Creatinine Ratio, Urine   Physical exam, annual - Primary   CPE completed today. Review of HM activities and recommendations discussed and provided on AVS. Anticipatory guidance, diet, and exercise recommendations provided. Medications, allergies, and hx reviewed and updated as necessary. Orders placed as listed below.  Plan: - Labs completed earlier today. Will make changes as necessary based on results.  - I will review these results and send recommendations via MyChart or a telephone call.  - F/U with CPE in 1 year or sooner for acute/chronic  health needs as directed.        Relevant Orders   CBC with Differential/Platelet   CMP14+EGFR   Hemoglobin A1c   Lipid panel   Combined hyperlipidemia associated with type 2 diabetes mellitus (HCC)   Chronic. Discontinuation of statin therapy due to concerns this was causing trigger finger in her hands. She is monitoring for improvement and  working on diet and exercise for management.       Attention deficit hyperactivity disorder (ADHD), predominantly inattentive type   Intermittent nausea and headache associated with amphetamine  salts formulation. Prefers dextroamphetamine -amphetamine  formulation due to adverse effects with amphetamine  salts. - Prescribe dextroamphetamine -amphetamine  only, avoid amphetamine  salts formulation      Relevant Medications   amphetamine -dextroamphetamine  (ADDERALL) 30 MG tablet (Start on 10/04/2024)   amphetamine -dextroamphetamine  (ADDERALL) 10 MG tablet (Start on 10/04/2024)   amphetamine -dextroamphetamine  (ADDERALL) 30 MG tablet (Start on 11/01/2024)   amphetamine -dextroamphetamine  (ADDERALL) 10 MG tablet (Start on 11/01/2024)   amphetamine -dextroamphetamine  (ADDERALL) 30 MG tablet   amphetamine -dextroamphetamine  (ADDERALL) 10 MG tablet   Low TSH level   Labs completed earlier today. No alarm symptoms present at this time.       Relevant Orders   TSH   Acquired trigger finger   Chronic bilateral trigger finger with limited range of motion and pain, particularly in the morning. Symptoms have not improved significantly after discontinuation of atorvastatin . Prefers to avoid surgery due to risk of nerve damage and potential for rare side effects. - Continue hand exercises and hot/cold therapy - Avoid surgery due to risk of nerve damage       Vitamin D  deficiency   Chronic vitamin D  deficiency likely due to limited sun exposure. Vitamin D  levels remain low despite supplementation. - Continue vitamin D  supplementation      Relevant Orders    Vitamin D , 25-hydroxy   Other Visit Diagnoses       Need for influenza vaccination       Relevant Orders   Flu vaccine trivalent PF, 6mos and older(Flulaval,Afluria,Fluarix,Fluzone) (Completed)     Need for shingles vaccine          Return for shingles vaccine with nurse visit only next week    Follow up plan: Return in about 6 months (around 03/06/2025) for Med Management 30.  NEXT PREVENTATIVE PHYSICAL DUE IN 1 YEAR.  PATIENT COUNSELING PROVIDED FOR ALL ADULT PATIENTS: A well balanced diet low in saturated fats, cholesterol, and moderation in carbohydrates.  This can be as simple as monitoring portion sizes and cutting back on sugary beverages such as soda and juice to start with.    Daily water consumption of at least 64 ounces.  Physical activity at least 180 minutes per week.  If just starting out, start 10 minutes a day and work your way up.   This can be as simple as taking the stairs instead of the elevator and walking 2-3 laps around the office  purposefully every day.   STD protection, partner selection, and regular testing if high risk.  Limited consumption of alcoholic beverages if alcohol is consumed. For men, I recommend no more than 14 alcoholic beverages per week, spread out throughout the week (max 2 per day). Avoid binge drinking or consuming large quantities of alcohol in one setting.  Please let me know if you feel you may need help with reduction or quitting alcohol consumption.   Avoidance of nicotine, if used. Please let me know if you feel you may need help with reduction or quitting nicotine use.   Daily mental health attention. This can be in the form of 5 minute daily meditation, prayer, journaling, yoga, reflection, etc.  Purposeful attention to your emotions and mental state can significantly improve your overall wellbeing  and  Health.  Please know that I am here to help you with all of your health care goals and am happy to work  with you to  find a solution that works best for you.  The greatest advice I have received with any changes in life are to take it one step at a time, that even means if all you can focus on is the next 60 seconds, then do that and celebrate your victories.  With any changes in life, you will have set backs, and that is OK. The important thing to remember is, if you have a set back, it is not a failure, it is an opportunity to try again! Screening Testing Mammogram Every 1 -2 years based on history and risk factors Starting at age 68 Pap Smear Ages 21-39 every 3 years Ages 23-65 every 5 years with HPV testing More frequent testing may be required based on results and history Colon Cancer Screening Every 1-10 years based on test performed, risk factors, and history Starting at age 77 Bone Density Screening Every 2-10 years based on history Starting at age 53 for women Recommendations for men differ based on medication usage, history, and risk factors AAA Screening One time ultrasound Men 64-26 years old who have every smoked Lung Cancer Screening Low Dose Lung CT every 12 months Age 43-80 years with a 30 pack-year smoking history who still smoke or who have quit within the last 15 years   Screening Labs Routine  Labs: Complete Blood Count (CBC), Complete Metabolic Panel (CMP), Cholesterol (Lipid Panel) Every 6-12 months based on history and medications May be recommended more frequently based on current conditions or previous results Hemoglobin A1c Lab Every 3-12 months based on history and previous results Starting at age 52 or earlier with diagnosis of diabetes, high cholesterol, BMI >26, and/or risk factors Frequent monitoring for patients with diabetes to ensure blood sugar control Thyroid  Panel (TSH) Every 6 months based on history, symptoms, and risk factors May be repeated more often if on medication HIV One time testing for all patients 39 and older May be repeated more frequently  for patients with increased risk factors or exposure Hepatitis C One time testing for all patients 38 and older May be repeated more frequently for patients with increased risk factors or exposure Gonorrhea, Chlamydia Every 12 months for all sexually active persons 13-24 years Additional monitoring may be recommended for those who are considered high risk or who have symptoms Every 12 months for any woman on birth control, regardless of sexual activity PSA Men 30-29 years old with risk factors Additional screening may be recommended from age 603-69 based on risk factors, symptoms, and history  Vaccine Recommendations Tetanus Booster All adults every 10 years Flu Vaccine All patients 6 months and older every year COVID Vaccine All patients 12 years and older Initial dosing with booster May recommend additional booster based on age and health history HPV Vaccine 2 doses all patients age 60-26 Dosing may be considered for patients over 26 Shingles Vaccine (Shingrix) 2 doses all adults 55 years and older Pneumonia (Pneumovax 79) All adults 65 years and older May recommend earlier dosing based on health history One year apart from Prevnar 52 Pneumonia (Prevnar 44) All adults 65 years and older Dosed 1 year after Pneumovax 23 Pneumonia (Prevnar 20) One time alternative to the two dosing of 13 and 23 For all adults with initial dose of 23, 20 is recommended 1 year later For all adults with initial dose of 13, 23 is still recommended as second option 1 year later

## 2024-09-06 ENCOUNTER — Ambulatory Visit: Admitting: Nurse Practitioner

## 2024-09-06 ENCOUNTER — Encounter: Payer: Self-pay | Admitting: Nurse Practitioner

## 2024-09-06 VITALS — BP 130/82 | HR 96 | Ht 62.25 in | Wt 142.0 lb

## 2024-09-06 DIAGNOSIS — E1165 Type 2 diabetes mellitus with hyperglycemia: Secondary | ICD-10-CM | POA: Diagnosis not present

## 2024-09-06 DIAGNOSIS — R7989 Other specified abnormal findings of blood chemistry: Secondary | ICD-10-CM | POA: Diagnosis not present

## 2024-09-06 DIAGNOSIS — E559 Vitamin D deficiency, unspecified: Secondary | ICD-10-CM | POA: Diagnosis not present

## 2024-09-06 DIAGNOSIS — Z0000 Encounter for general adult medical examination without abnormal findings: Secondary | ICD-10-CM | POA: Diagnosis not present

## 2024-09-06 DIAGNOSIS — Z23 Encounter for immunization: Secondary | ICD-10-CM | POA: Diagnosis not present

## 2024-09-06 DIAGNOSIS — F900 Attention-deficit hyperactivity disorder, predominantly inattentive type: Secondary | ICD-10-CM

## 2024-09-06 DIAGNOSIS — E1169 Type 2 diabetes mellitus with other specified complication: Secondary | ICD-10-CM

## 2024-09-06 DIAGNOSIS — M6530 Trigger finger, unspecified finger: Secondary | ICD-10-CM

## 2024-09-06 DIAGNOSIS — E782 Mixed hyperlipidemia: Secondary | ICD-10-CM

## 2024-09-06 LAB — LIPID PANEL

## 2024-09-06 MED ORDER — AMPHETAMINE-DEXTROAMPHETAMINE 30 MG PO TABS: 30.0000 mg | ORAL_TABLET | Freq: Every day | ORAL | 0 refills | Status: DC

## 2024-09-06 MED ORDER — AMPHETAMINE-DEXTROAMPHETAMINE 10 MG PO TABS: 10.0000 mg | ORAL_TABLET | Freq: Every day | ORAL | 0 refills | Status: DC

## 2024-09-06 MED ORDER — AMPHETAMINE-DEXTROAMPHETAMINE 10 MG PO TABS
10.0000 mg | ORAL_TABLET | Freq: Every day | ORAL | 0 refills | Status: DC
Start: 2024-09-06 — End: 2024-09-06

## 2024-09-06 NOTE — Assessment & Plan Note (Signed)
 Chronic. Discontinuation of statin therapy due to concerns this was causing trigger finger in her hands. She is monitoring for improvement and working on diet and exercise for management.

## 2024-09-06 NOTE — Assessment & Plan Note (Signed)
 Chronic bilateral trigger finger with limited range of motion and pain, particularly in the morning. Symptoms have not improved significantly after discontinuation of atorvastatin . Prefers to avoid surgery due to risk of nerve damage and potential for rare side effects. - Continue hand exercises and hot/cold therapy - Avoid surgery due to risk of nerve damage

## 2024-09-06 NOTE — Assessment & Plan Note (Signed)
 Chronic vitamin D  deficiency likely due to limited sun exposure. Vitamin D  levels remain low despite supplementation. - Continue vitamin D  supplementation

## 2024-09-06 NOTE — Assessment & Plan Note (Signed)
 Intermittent nausea and headache associated with amphetamine  salts formulation. Prefers dextroamphetamine -amphetamine  formulation due to adverse effects with amphetamine  salts. - Prescribe dextroamphetamine -amphetamine  only, avoid amphetamine  salts formulation

## 2024-09-06 NOTE — Assessment & Plan Note (Signed)
 CPE completed today. Review of HM activities and recommendations discussed and provided on AVS. Anticipatory guidance, diet, and exercise recommendations provided. Medications, allergies, and hx reviewed and updated as necessary. Orders placed as listed below.  Plan: - Labs completed earlier today. Will make changes as necessary based on results.  - I will review these results and send recommendations via MyChart or a telephone call.  - F/U with CPE in 1 year or sooner for acute/chronic health needs as directed.

## 2024-09-06 NOTE — Assessment & Plan Note (Signed)
 Type 2 diabetes mellitus with good glycemic control. Issues with adhesive reaction from glucose monitor. Will plan to continue to monitor.

## 2024-09-06 NOTE — Assessment & Plan Note (Signed)
 Labs completed earlier today. No alarm symptoms present at this time.

## 2024-09-06 NOTE — Patient Instructions (Signed)

## 2024-09-07 LAB — CMP14+EGFR
ALT: 24 IU/L (ref 0–32)
AST: 26 IU/L (ref 0–40)
Albumin: 4.8 g/dL (ref 3.8–4.9)
Alkaline Phosphatase: 84 IU/L (ref 49–135)
BUN/Creatinine Ratio: 26 — AB (ref 9–23)
BUN: 22 mg/dL (ref 6–24)
Bilirubin Total: 0.3 mg/dL (ref 0.0–1.2)
CO2: 20 mmol/L (ref 20–29)
Calcium: 9.8 mg/dL (ref 8.7–10.2)
Chloride: 101 mmol/L (ref 96–106)
Creatinine, Ser: 0.86 mg/dL (ref 0.57–1.00)
Globulin, Total: 2.4 g/dL (ref 1.5–4.5)
Glucose: 83 mg/dL (ref 70–99)
Potassium: 4.4 mmol/L (ref 3.5–5.2)
Sodium: 137 mmol/L (ref 134–144)
Total Protein: 7.2 g/dL (ref 6.0–8.5)
eGFR: 81 mL/min/1.73 (ref >59)

## 2024-09-07 LAB — MICROALBUMIN / CREATININE URINE RATIO
Creatinine, Urine: 119.7 mg/dL
Microalb/Creat Ratio: 4 mg/g{creat} (ref 0–29)
Microalbumin, Urine: 5.1 ug/mL

## 2024-09-07 LAB — CBC WITH DIFFERENTIAL/PLATELET
Basophils Absolute: 0.1 x10E3/uL (ref 0.0–0.2)
Basos: 1 %
EOS (ABSOLUTE): 0.1 x10E3/uL (ref 0.0–0.4)
Eos: 1 %
Hematocrit: 42.5 % (ref 34.0–46.6)
Hemoglobin: 13.7 g/dL (ref 11.1–15.9)
Immature Grans (Abs): 0 x10E3/uL (ref 0.0–0.1)
Immature Granulocytes: 0 %
Lymphocytes Absolute: 3.2 x10E3/uL — ABNORMAL HIGH (ref 0.7–3.1)
Lymphs: 36 %
MCH: 30 pg (ref 26.6–33.0)
MCHC: 32.2 g/dL (ref 31.5–35.7)
MCV: 93 fL (ref 79–97)
Monocytes Absolute: 0.5 x10E3/uL (ref 0.1–0.9)
Monocytes: 6 %
Neutrophils Absolute: 4.9 x10E3/uL (ref 1.4–7.0)
Neutrophils: 56 %
Platelets: 300 x10E3/uL (ref 150–450)
RBC: 4.57 x10E6/uL (ref 3.77–5.28)
RDW: 11.8 % (ref 11.7–15.4)
WBC: 8.7 x10E3/uL (ref 3.4–10.8)

## 2024-09-07 LAB — HEMOGLOBIN A1C
Est. average glucose Bld gHb Est-mCnc: 105 mg/dL
Hgb A1c MFr Bld: 5.3 % (ref 4.8–5.6)

## 2024-09-07 LAB — LIPID PANEL
Cholesterol, Total: 235 mg/dL — AB (ref 100–199)
HDL: 60 mg/dL (ref >39)
LDL CALC COMMENT:: 3.9 ratio (ref 0.0–4.4)
LDL Chol Calc (NIH): 162 mg/dL — AB (ref 0–99)
Triglycerides: 74 mg/dL (ref 0–149)
VLDL Cholesterol Cal: 13 mg/dL (ref 5–40)

## 2024-09-07 LAB — VITAMIN D 25 HYDROXY (VIT D DEFICIENCY, FRACTURES): Vit D, 25-Hydroxy: 77.3 ng/mL (ref 30.0–100.0)

## 2024-09-07 LAB — TSH: TSH: 0.987 u[IU]/mL (ref 0.450–4.500)

## 2024-09-14 ENCOUNTER — Ambulatory Visit: Payer: Self-pay | Admitting: Nurse Practitioner

## 2024-09-15 ENCOUNTER — Other Ambulatory Visit

## 2024-09-15 DIAGNOSIS — Z006 Encounter for examination for normal comparison and control in clinical research program: Secondary | ICD-10-CM

## 2024-09-22 ENCOUNTER — Other Ambulatory Visit: Payer: Self-pay | Admitting: Nurse Practitioner

## 2024-09-22 DIAGNOSIS — I700 Atherosclerosis of aorta: Secondary | ICD-10-CM

## 2024-09-22 DIAGNOSIS — E1169 Type 2 diabetes mellitus with other specified complication: Secondary | ICD-10-CM

## 2024-09-22 DIAGNOSIS — R030 Elevated blood-pressure reading, without diagnosis of hypertension: Secondary | ICD-10-CM

## 2024-09-22 DIAGNOSIS — E1165 Type 2 diabetes mellitus with hyperglycemia: Secondary | ICD-10-CM

## 2024-09-22 MED ORDER — EZETIMIBE 10 MG PO TABS: 10.0000 mg | ORAL_TABLET | Freq: Every day | ORAL | 1 refills | Status: AC

## 2024-09-24 LAB — GENECONNECT MOLECULAR SCREEN: Genetic Analysis Overall Interpretation: NEGATIVE

## 2024-10-06 ENCOUNTER — Other Ambulatory Visit: Payer: Self-pay | Admitting: Nurse Practitioner

## 2024-10-06 DIAGNOSIS — N951 Menopausal and female climacteric states: Secondary | ICD-10-CM

## 2024-10-10 ENCOUNTER — Encounter: Payer: Self-pay | Admitting: Radiology

## 2024-10-28 ENCOUNTER — Ambulatory Visit: Admitting: Medical

## 2024-10-28 VITALS — BP 110/72 | HR 102 | Temp 98.3°F | Wt 142.0 lb

## 2024-10-28 DIAGNOSIS — R300 Dysuria: Secondary | ICD-10-CM

## 2024-10-28 DIAGNOSIS — N951 Menopausal and female climacteric states: Secondary | ICD-10-CM | POA: Diagnosis not present

## 2024-10-28 DIAGNOSIS — Z87442 Personal history of urinary calculi: Secondary | ICD-10-CM

## 2024-10-28 LAB — POCT URINALYSIS DIP (PROADVANTAGE DEVICE)
Bilirubin, UA: NEGATIVE
Blood, UA: NEGATIVE
Glucose, UA: NEGATIVE mg/dL
Ketones, POC UA: NEGATIVE mg/dL
Leukocytes, UA: NEGATIVE
Nitrite, UA: NEGATIVE
Protein Ur, POC: NEGATIVE mg/dL
Specific Gravity, Urine: 1.02
Urobilinogen, Ur: NEGATIVE
pH, UA: 6 (ref 5.0–8.0)

## 2024-10-28 MED ORDER — NITROFURANTOIN MONOHYD MACRO 100 MG PO CAPS: 100.0000 mg | ORAL_CAPSULE | Freq: Two times a day (BID) | ORAL | 0 refills | Status: AC

## 2024-10-28 NOTE — Progress Notes (Signed)
 Subjective:  Haley Charles is a 53 y.o. female who presents for Chief Complaint  Patient presents with   Acute Visit    Burning with urination, strong odor in the morning, been going on for 2 half weeks. Been trying OTC medication but not working     Haley Charles is a 53 year old female with diabetes who presents with urinary symptoms.  She began experiencing urinary symptoms shortly after starting Zetia  in October, as she could not tolerate other statins due to exacerbation of her trigger finger. The symptoms have included dark urine and a burning sensation during urination, prompting her to discontinue Zetia  around November 1st. Despite cessation of the medication, she continues to experience burning, odor, and increased frequency of urination, particularly in the mornings.  She has attempted to manage these symptoms with Azo and increased water intake, but they have not resolved. No fever, body aches, chills, nausea, vomiting, diarrhea, or vaginal discharge. She is in a monogamous relationship for 25 years, with no concern for sexually transmitted diseases. She has not experienced frequent urinary tract infections in the past and has not changed her soap or hygiene products recently. She has been consuming more sparkling water and diet sugar-free Sprite for over a year and a half.  Her current medications include Adderall, estradiol  gel (transdermal), Prozac , hydroxyzine , and Ozempic  for diabetes management. She has a history of kidney stones, for which she underwent surgery and had a stent placed, which was later removed. She has not had a menstrual period in years following an ablation procedure and has previously tried estrogen cream without success, now using estradiol  gel effectively.  No other aggravating or relieving factors.    No other c/o.  The following portions of the patient's history were reviewed and updated as appropriate: allergies, current medications, past family history, past  medical history, past social history, past surgical history and problem list.  ROS Otherwise as in subjective above  Objective: BP 110/72   Pulse (!) 102   Temp 98.3 F (36.8 C)   Wt 142 lb (64.4 kg)   SpO2 98%   BMI 25.76 kg/m   General appearance: alert, no distress, well developed, well nourished Abdomen: +bs, soft, non tender, non distended, no masses, no hepatomegaly, no splenomegaly No cva tenderness Pulses: 2+ radial pulses, 2+ pedal pulses, normal cap refill Ext: no edema   Assessment: Encounter Diagnoses  Name Primary?   Burning with urination Yes   Dysuria    History of kidney stones    Vaginal dryness, menopausal      Plan: Dysuria and suspected urinary tract infection Dysuria with burning, odor, and frequency. Urinalysis normal, urine culture pending. Differential includes UTI, atrophic changes, irritation from products, or diet. Zetia  unlikely cause. No current obvious kidney stones. - Sent urine for culture. - Prescribed Macrobid  (nitrofurantoin ) twice daily for one week. - Advised to start antibiotics if symptoms worsen or culture indicates infection. - Educated on interpreting MyChart results for urine culture. - Discussed potential causes including atrophic changes, hygiene products, and diet.  Menopausal state Postmenopausal with no periods due to ablation. Using estradiol  gel transdermal. Discussed atrophic changes contributing to urinary symptoms. - Continue estradiol  gel transdermally  [name] was seen today for acute visit.  Diagnoses and all orders for this visit:  Burning with urination -     POCT Urinalysis DIP (Proadvantage Device) -     Urine Culture  Dysuria -     Urine Culture  History of kidney  stones  Vaginal dryness, menopausal  Other orders -     nitrofurantoin , macrocrystal-monohydrate, (MACROBID ) 100 MG capsule; Take 1 capsule (100 mg total) by mouth 2 (two) times daily.   Follow up: pending culture

## 2024-11-01 ENCOUNTER — Ambulatory Visit: Payer: Self-pay | Admitting: Medical

## 2024-11-01 LAB — URINE CULTURE

## 2024-11-01 NOTE — Progress Notes (Signed)
 Results thru my chart

## 2024-11-16 ENCOUNTER — Encounter: Payer: Self-pay | Admitting: Orthopaedic Surgery

## 2024-11-17 ENCOUNTER — Other Ambulatory Visit: Payer: Self-pay | Admitting: Nurse Practitioner

## 2024-11-17 DIAGNOSIS — E1169 Type 2 diabetes mellitus with other specified complication: Secondary | ICD-10-CM

## 2024-11-17 DIAGNOSIS — I708 Atherosclerosis of other arteries: Secondary | ICD-10-CM

## 2024-11-17 DIAGNOSIS — E1165 Type 2 diabetes mellitus with hyperglycemia: Secondary | ICD-10-CM

## 2024-11-17 DIAGNOSIS — Z6830 Body mass index (BMI) 30.0-30.9, adult: Secondary | ICD-10-CM

## 2024-11-17 DIAGNOSIS — R030 Elevated blood-pressure reading, without diagnosis of hypertension: Secondary | ICD-10-CM

## 2024-12-19 ENCOUNTER — Encounter: Payer: Self-pay | Admitting: Nurse Practitioner

## 2024-12-19 DIAGNOSIS — F900 Attention-deficit hyperactivity disorder, predominantly inattentive type: Secondary | ICD-10-CM

## 2024-12-20 ENCOUNTER — Other Ambulatory Visit: Payer: Self-pay | Admitting: Nurse Practitioner

## 2024-12-20 DIAGNOSIS — F900 Attention-deficit hyperactivity disorder, predominantly inattentive type: Secondary | ICD-10-CM

## 2024-12-20 NOTE — Telephone Encounter (Unsigned)
 Copied from CRM [id number]. Topic: Clinical - Medication Refill >> Dec 20, 2024  2:01 PM Darshell M wrote: Medication:  amphetamine -dextroamphetamine  (ADDERALL) 10 MG tablet  amphetamine -dextroamphetamine  (ADDERALL) 30 MG tablet    Has the patient contacted their pharmacy? Yes (Agent: If no, request that the patient contact the pharmacy for the refill. If patient does not wish to contact the pharmacy document the reason why and proceed with request.) (Agent: If yes, when and what did the pharmacy advise?)  This is the patient's preferred pharmacy:  [facility] [address] [place], [place] - [address] [employer] [place] [address] Phone: [phone] Fax: [phone]  Is this the correct pharmacy for this prescription? Yes If no, delete pharmacy and type the correct one.   Has the prescription been filled recently? No  Is the patient out of the medication? Yes  Has the patient been seen for an appointment in the last year OR does the patient have an upcoming appointment? Yes  Can we respond through MyChart? Yes  Agent: Please be advised that Rx refills may take up to 3 business days. We ask that you follow-up with your pharmacy.

## 2024-12-20 NOTE — Telephone Encounter (Signed)
 Last appt 09/06/24

## 2024-12-21 MED ORDER — AMPHETAMINE-DEXTROAMPHETAMINE 30 MG PO TABS: 30.0000 mg | ORAL_TABLET | Freq: Every day | ORAL | 0 refills | Status: AC

## 2024-12-21 MED ORDER — AMPHETAMINE-DEXTROAMPHETAMINE 10 MG PO TABS: 10.0000 mg | ORAL_TABLET | Freq: Every day | ORAL | 0 refills | Status: AC

## 2024-12-28 ENCOUNTER — Other Ambulatory Visit: Payer: Self-pay

## 2024-12-28 ENCOUNTER — Encounter (HOSPITAL_BASED_OUTPATIENT_CLINIC_OR_DEPARTMENT_OTHER): Payer: Self-pay | Admitting: Orthopaedic Surgery

## 2024-12-28 NOTE — Progress Notes (Signed)
" °   12/28/24 1321  PAT Phone Screen  Is the patient taking a GLP-1 receptor agonist? (S)  Yes (Ozempic  LD 12/26/24)  Has the patient been informed on holding medication? Yes  Do You Have Diabetes? Yes  Do You Have Hypertension? No  Have You Ever Been to the ER for Asthma? No  Have You Taken Oral Steroids in the Past 3 Months? No  Do you Take Phenteramine or any Other Diet Drugs? No  Recent  Lab Work, EKG, CXR? No  Do you have a history of heart problems? No  Any Recent Hospitalizations? No  Height 5' 2 (1.575 m)  Weight 62.6 kg  Pat Appointment Scheduled (S)  Yes (EKG, BMP)    "

## 2024-12-29 ENCOUNTER — Other Ambulatory Visit: Payer: Self-pay | Admitting: Physician Assistant

## 2024-12-29 MED ORDER — HYDROCODONE-ACETAMINOPHEN 5-325 MG PO TABS: 1.0000 | ORAL_TABLET | Freq: Three times a day (TID) | ORAL | 0 refills | Status: AC | PRN

## 2025-01-02 ENCOUNTER — Encounter: Payer: Self-pay | Admitting: Orthopaedic Surgery

## 2025-01-04 ENCOUNTER — Ambulatory Visit (HOSPITAL_BASED_OUTPATIENT_CLINIC_OR_DEPARTMENT_OTHER): Admission: RE | Admit: 2025-01-04 | Source: Home / Self Care | Admitting: Orthopaedic Surgery

## 2025-01-04 ENCOUNTER — Encounter (HOSPITAL_BASED_OUTPATIENT_CLINIC_OR_DEPARTMENT_OTHER): Admission: RE | Payer: Self-pay

## 2025-01-04 DIAGNOSIS — E1165 Type 2 diabetes mellitus with hyperglycemia: Secondary | ICD-10-CM

## 2025-01-17 ENCOUNTER — Encounter: Admitting: Physician Assistant

## 2025-02-01 ENCOUNTER — Encounter (HOSPITAL_BASED_OUTPATIENT_CLINIC_OR_DEPARTMENT_OTHER): Payer: Self-pay

## 2025-02-01 ENCOUNTER — Ambulatory Visit (HOSPITAL_BASED_OUTPATIENT_CLINIC_OR_DEPARTMENT_OTHER): Admit: 2025-02-01 | Admitting: Orthopaedic Surgery

## 2025-02-14 ENCOUNTER — Encounter: Admitting: Physician Assistant

## 2025-03-06 ENCOUNTER — Ambulatory Visit: Payer: Self-pay | Admitting: Nurse Practitioner

## 2025-09-21 ENCOUNTER — Encounter: Payer: Self-pay | Admitting: Nurse Practitioner
